# Patient Record
Sex: Female | Born: 1940 | Race: White | Hispanic: No | Marital: Married | Smoking: Never smoker
Health system: Southern US, Community
[De-identification: ages and names within clinical notes are randomized; demographics above are authoritative.]

## PROBLEM LIST (undated history)

## (undated) DIAGNOSIS — F419 Anxiety disorder, unspecified: Secondary | ICD-10-CM

## (undated) DIAGNOSIS — G43909 Migraine, unspecified, not intractable, without status migrainosus: Secondary | ICD-10-CM

## (undated) DIAGNOSIS — C801 Malignant (primary) neoplasm, unspecified: Secondary | ICD-10-CM

## (undated) DIAGNOSIS — I10 Essential (primary) hypertension: Secondary | ICD-10-CM

## (undated) HISTORY — PX: CATARACT EXTRACTION: SUR2

## (undated) HISTORY — PX: JOINT REPLACEMENT: SHX530

## (undated) HISTORY — PX: EYE SURGERY: SHX253

## (undated) HISTORY — PX: BACK SURGERY: SHX140

## (undated) HISTORY — PX: KNEE SURGERY: SHX244

---

## 2006-03-06 ENCOUNTER — Emergency Department: Payer: Self-pay | Admitting: Emergency Medicine

## 2006-03-06 ENCOUNTER — Other Ambulatory Visit: Payer: Self-pay

## 2009-05-03 DIAGNOSIS — K5730 Diverticulosis of large intestine without perforation or abscess without bleeding: Secondary | ICD-10-CM | POA: Insufficient documentation

## 2011-12-31 DIAGNOSIS — M23305 Other meniscus derangements, unspecified medial meniscus, unspecified knee: Secondary | ICD-10-CM | POA: Insufficient documentation

## 2012-03-06 DIAGNOSIS — M1710 Unilateral primary osteoarthritis, unspecified knee: Secondary | ICD-10-CM | POA: Insufficient documentation

## 2012-04-20 DIAGNOSIS — L258 Unspecified contact dermatitis due to other agents: Secondary | ICD-10-CM | POA: Insufficient documentation

## 2012-12-03 DIAGNOSIS — K219 Gastro-esophageal reflux disease without esophagitis: Secondary | ICD-10-CM | POA: Insufficient documentation

## 2013-03-15 DIAGNOSIS — E785 Hyperlipidemia, unspecified: Secondary | ICD-10-CM | POA: Insufficient documentation

## 2013-08-01 ENCOUNTER — Ambulatory Visit: Payer: Self-pay | Admitting: Internal Medicine

## 2014-07-25 DIAGNOSIS — G43709 Chronic migraine without aura, not intractable, without status migrainosus: Secondary | ICD-10-CM | POA: Insufficient documentation

## 2015-01-19 ENCOUNTER — Encounter
Admission: RE | Admit: 2015-01-19 | Discharge: 2015-01-19 | Disposition: A | Discharge status: Home / Self Care | Payer: Medicare Other | Source: Ambulatory Visit | Attending: Internal Medicine | Admitting: Internal Medicine

## 2015-01-20 ENCOUNTER — Other Ambulatory Visit
Admission: RE | Admit: 2015-01-20 | Discharge: 2015-01-20 | Disposition: A | Discharge status: Home / Self Care | Payer: Medicare Other | Source: Skilled Nursing Facility | Attending: Internal Medicine | Admitting: Internal Medicine

## 2015-01-20 DIAGNOSIS — R350 Frequency of micturition: Secondary | ICD-10-CM | POA: Diagnosis present

## 2015-01-20 DIAGNOSIS — R3919 Other difficulties with micturition: Secondary | ICD-10-CM | POA: Insufficient documentation

## 2015-01-20 LAB — URINALYSIS COMPLETE WITH MICROSCOPIC (ARMC ONLY)
Bilirubin Urine: NEGATIVE
Glucose, UA: NEGATIVE mg/dL
Nitrite: NEGATIVE
PH: 7 (ref 5.0–8.0)
Protein, ur: 100 mg/dL — AB
SPECIFIC GRAVITY, URINE: 1.013 (ref 1.005–1.030)

## 2015-01-22 LAB — URINE CULTURE

## 2015-01-29 ENCOUNTER — Encounter
Admission: RE | Admit: 2015-01-29 | Discharge: 2015-01-29 | Disposition: A | Discharge status: Home / Self Care | Payer: Medicare Other | Source: Skilled Nursing Facility | Attending: Internal Medicine | Admitting: Internal Medicine

## 2015-01-29 DIAGNOSIS — R300 Dysuria: Secondary | ICD-10-CM | POA: Diagnosis present

## 2015-01-29 DIAGNOSIS — R350 Frequency of micturition: Secondary | ICD-10-CM | POA: Insufficient documentation

## 2015-01-29 LAB — URINALYSIS COMPLETE WITH MICROSCOPIC (ARMC ONLY)
Bacteria, UA: NONE SEEN
Bilirubin Urine: NEGATIVE
Glucose, UA: NEGATIVE mg/dL
HGB URINE DIPSTICK: NEGATIVE
KETONES UR: NEGATIVE mg/dL
Nitrite: NEGATIVE
PROTEIN: 30 mg/dL — AB
Specific Gravity, Urine: 1.028 (ref 1.005–1.030)
pH: 6 (ref 5.0–8.0)

## 2015-01-31 LAB — URINE CULTURE: Culture: 1000

## 2015-02-28 DIAGNOSIS — G250 Essential tremor: Secondary | ICD-10-CM | POA: Insufficient documentation

## 2016-08-13 ENCOUNTER — Ambulatory Visit
Admission: EM | Admit: 2016-08-13 | Discharge: 2016-08-13 | Disposition: A | Discharge status: Home / Self Care | Payer: Medicare Other | Attending: Family Medicine | Admitting: Family Medicine

## 2016-08-13 ENCOUNTER — Ambulatory Visit (INDEPENDENT_AMBULATORY_CARE_PROVIDER_SITE_OTHER): Payer: Medicare Other

## 2016-08-13 DIAGNOSIS — K5900 Constipation, unspecified: Secondary | ICD-10-CM

## 2016-08-13 DIAGNOSIS — R1011 Right upper quadrant pain: Secondary | ICD-10-CM | POA: Diagnosis not present

## 2016-08-13 HISTORY — DX: Essential (primary) hypertension: I10

## 2016-08-13 HISTORY — DX: Anxiety disorder, unspecified: F41.9

## 2016-08-13 LAB — COMPREHENSIVE METABOLIC PANEL
ALT: 22 U/L (ref 14–54)
AST: 28 U/L (ref 15–41)
Albumin: 4.9 g/dL (ref 3.5–5.0)
Alkaline Phosphatase: 95 U/L (ref 38–126)
Anion gap: 8 (ref 5–15)
BUN: 14 mg/dL (ref 6–20)
CO2: 30 mmol/L (ref 22–32)
CREATININE: 0.76 mg/dL (ref 0.44–1.00)
Calcium: 9.4 mg/dL (ref 8.9–10.3)
Chloride: 100 mmol/L — ABNORMAL LOW (ref 101–111)
Glucose, Bld: 106 mg/dL — ABNORMAL HIGH (ref 65–99)
POTASSIUM: 3.6 mmol/L (ref 3.5–5.1)
Sodium: 138 mmol/L (ref 135–145)
Total Bilirubin: 0.7 mg/dL (ref 0.3–1.2)
Total Protein: 7.9 g/dL (ref 6.5–8.1)

## 2016-08-13 LAB — URINALYSIS, COMPLETE (UACMP) WITH MICROSCOPIC
BACTERIA UA: NONE SEEN
Bilirubin Urine: NEGATIVE
GLUCOSE, UA: NEGATIVE mg/dL
Ketones, ur: NEGATIVE mg/dL
Leukocytes, UA: NEGATIVE
Nitrite: NEGATIVE
PH: 6 (ref 5.0–8.0)
Protein, ur: NEGATIVE mg/dL
SPECIFIC GRAVITY, URINE: 1.015 (ref 1.005–1.030)
Squamous Epithelial / LPF: NONE SEEN

## 2016-08-13 LAB — CBC WITH DIFFERENTIAL/PLATELET
Basophils Absolute: 0.1 10*3/uL (ref 0–0.1)
Basophils Relative: 1 %
Eosinophils Absolute: 0.2 10*3/uL (ref 0–0.7)
Eosinophils Relative: 3 %
HEMATOCRIT: 41.5 % (ref 35.0–47.0)
Hemoglobin: 13.6 g/dL (ref 12.0–16.0)
LYMPHS ABS: 3.1 10*3/uL (ref 1.0–3.6)
Lymphocytes Relative: 35 %
MCH: 29.7 pg (ref 26.0–34.0)
MCHC: 32.9 g/dL (ref 32.0–36.0)
MCV: 90.3 fL (ref 80.0–100.0)
Monocytes Absolute: 0.5 10*3/uL (ref 0.2–0.9)
Monocytes Relative: 6 %
Neutro Abs: 5 10*3/uL (ref 1.4–6.5)
Neutrophils Relative %: 55 %
Platelets: 231 10*3/uL (ref 150–440)
RBC: 4.59 MIL/uL (ref 3.80–5.20)
RDW: 14.2 % (ref 11.5–14.5)
WBC: 8.9 10*3/uL (ref 3.6–11.0)

## 2016-08-13 LAB — AMYLASE: Amylase: 55 U/L (ref 28–100)

## 2016-08-13 LAB — LIPASE, BLOOD: LIPASE: 24 U/L (ref 11–51)

## 2016-08-13 MED ORDER — MAGNESIUM CITRATE PO SOLN: ORAL | 0 refills | Status: DC

## 2016-08-13 NOTE — ED Provider Notes (Signed)
[facility]    CSN: [id number] Arrival date & time: 08/13/16  1106     History   Chief Complaint Chief Complaint  Patient presents with  . Abdominal Pain    lower right    HPI Paula Daniels is a 76 y.o. female.   She is 76 year old white female with a history of abdominal pain. Patient states the last week she's had increasing abdominal pain. She's had history of some abdominal discomfort before but nothing at this level. She states that she's had trouble with constipation for the past. She's been on stool softener uses stool softener makes a difference in tears the constipation. She states that she's not had normal bowel movement several days she did have a bowel movement today but she admits it was not her normal bowel movement. She has had a colonoscopy before in the past. She states that she's has no major medical problems other than some hypertension. Previous surgeries knee surgery and cataract surgery. No pertinent family medical history relevant to today's visit and patient does not smoke this time. She is allergic to Cipro Flagyl and codeine. Should be noted the patient has had diverticulitis before in the past but states when she is not to glide she'll have diarrhea and not constipation.   The history is provided by the patient. No language interpreter was used.  Abdominal Pain  Pain location:  Generalized Pain quality: bloating, cramping, fullness, heavy and pressure   Pain radiates to:  Does not radiate Onset quality:  Unable to specify Timing:  Constant Progression:  Worsening Chronicity:  New Context: diet changes   Relieved by:  Nothing Worsened by:  Nothing Associated symptoms: anorexia, constipation, flatus and nausea   Associated symptoms: no diarrhea and no vomiting   Risk factors: has not had multiple surgeries and no recent hospitalization     Past Medical History:  Diagnosis Date  . Anxiety   . Hypertension     There are no active  problems to display for this patient.   Past Surgical History:  Procedure Laterality Date  . CATARACT EXTRACTION Bilateral   . KNEE SURGERY Right     OB History    No data available       Home Medications    Prior to Admission medications   Medication Sig Start Date End Date Taking? Authorizing Provider  ALPRAZolam Duanne Moron) 0.5 MG tablet Take 0.5 mg by mouth as needed for anxiety.   Yes Historical Provider, MD  amitriptyline (ELAVIL) 25 MG tablet Take 25 mg by mouth at bedtime.   Yes Historical Provider, MD  aspirin 81 MG chewable tablet Chew by mouth daily.   Yes Historical Provider, MD  atorvastatin (LIPITOR) 20 MG tablet Take 20 mg by mouth daily.   Yes Historical Provider, MD  docusate sodium (COLACE) 100 MG capsule Take 100 mg by mouth 2 (two) times daily.   Yes Historical Provider, MD  hydrochlorothiazide (HYDRODIURIL) 25 MG tablet Take 25 mg by mouth daily.   Yes Historical Provider, MD  omeprazole (PRILOSEC) 40 MG capsule Take 40 mg by mouth daily.   Yes Historical Provider, MD  propranolol (INDERAL) 40 MG tablet Take 40 mg by mouth 3 (three) times daily.   Yes Historical Provider, MD  venlafaxine (EFFEXOR) 37.5 MG tablet Take 37.5 mg by mouth daily.   Yes Historical Provider, MD  magnesium citrate SOLN Take one half the bottle on day 1 if unsuccessful please take a whole bottle the second day and  if still not successful she can follow the second bottle 8 hours with remnants of the first bottle 08/13/16   Frederich Cha, MD    Family History History reviewed. No pertinent family history.  Social History Social History  Substance Use Topics  . Smoking status: Never Smoker  . Smokeless tobacco: Never Used  . Alcohol use No     Allergies   Ciprofloxacin; Codeine; and Flagyl [metronidazole]   Review of Systems Review of Systems  Gastrointestinal: Positive for abdominal pain, anorexia, constipation, flatus and nausea. Negative for diarrhea and vomiting.     Physical  Exam Triage Vital Signs ED Triage Vitals  Enc Vitals Group     BP 08/13/16 1215 (!) 156/73     Pulse Rate 08/13/16 1215 (!) 57     Resp 08/13/16 1215 20     Temp 08/13/16 1215 97.7 F (36.5 C)     Temp Source 08/13/16 1215 Oral     SpO2 --      Weight 08/13/16 1215 170 lb (77.1 kg)     Height 08/13/16 1215 5\' 6"  (1.676 m)     Head Circumference --      Peak Flow --      Pain Score 08/13/16 1219 6     Pain Loc --      Pain Edu? --      Excl. in [place]? --    No data found.   Updated Vital Signs BP (!) 156/73 (BP Location: Left Arm)   Pulse (!) 57   Temp 97.7 F (36.5 C) (Oral)   Resp 20   Ht 5\' 6"  (1.676 m)   Wt 170 lb (77.1 kg)   BMI 27.44 kg/m   Visual Acuity Right Eye Distance:   Left Eye Distance:   Bilateral Distance:    Right Eye Near:   Left Eye Near:    Bilateral Near:     Physical Exam  Constitutional: She appears well-developed and well-nourished.  HENT:  Head: Normocephalic and atraumatic.  Right Ear: External ear normal.  Left Ear: External ear normal.  Mouth/Throat: Oropharynx is clear and moist.  Eyes: Pupils are equal, round, and reactive to light.  Neck: Normal range of motion. Neck supple.  Cardiovascular: Normal rate and regular rhythm.   Pulmonary/Chest: Effort normal.  Abdominal: Soft. She exhibits distension. Bowel sounds are decreased. There is no hepatosplenomegaly. There is generalized tenderness. There is no rigidity and no CVA tenderness. No hernia.    Patient had hyperactive bowel sounds. Diffuse abdominal tenderness mostly tenderness over the right upper quadrant and right lower quadrant but still some tenderness over the left as well.  Lymphadenopathy:    She has no cervical adenopathy.  Vitals reviewed.    UC Treatments / Results  Labs (all labs ordered are listed, but only abnormal results are displayed) Labs Reviewed  COMPREHENSIVE METABOLIC PANEL - Abnormal; Notable for the following:       Result Value   Chloride 100  (*)    Glucose, Bld 106 (*)    All other components within normal limits  URINALYSIS, COMPLETE (UACMP) WITH MICROSCOPIC - Abnormal; Notable for the following:    Color, Urine STRAW (*)    Hgb urine dipstick TRACE (*)    All other components within normal limits  URINE CULTURE  CBC WITH DIFFERENTIAL/PLATELET  LIPASE, BLOOD  AMYLASE    EKG  EKG Interpretation None       Radiology Dg Abd Acute W/chest  Result Date: 08/13/2016 CLINICAL  DATA:  Right lower quadrant pain, right flank pain for 3 weeks EXAM: DG ABDOMEN ACUTE W/ 1V CHEST COMPARISON:  None. FINDINGS: There is no evidence of dilated bowel loops or free intraperitoneal air. There is a moderate amount of stool throughout the colon. No radiopaque calculi or other significant radiographic abnormality is seen. Heart size and mediastinal contours are within normal limits. Both lungs are clear. IMPRESSION: Negative abdominal radiographs.  No acute cardiopulmonary disease. Electronically Signed   By: Kathreen Devoid   On: 08/13/2016 13:29    Procedures Procedures (including critical care time)  Medications Ordered in UC Medications - No data to display   Initial Impression / Assessment and Plan / UC Course  I have reviewed the triage vital signs and the nursing notes.  Pertinent labs & imaging results that were available during my care of the patient were reviewed by me and considered in my medical decision making (see chart for details).   Results for orders placed or performed during the hospital encounter of 08/13/16  CBC with Differential  Result Value Ref Range   WBC 8.9 3.6 - 11.0 K/uL   RBC 4.59 3.80 - 5.20 MIL/uL   Hemoglobin 13.6 12.0 - 16.0 g/dL   HCT 41.5 35.0 - 47.0 %   MCV 90.3 80.0 - 100.0 fL   MCH 29.7 26.0 - 34.0 pg   MCHC 32.9 32.0 - 36.0 g/dL   RDW 14.2 11.5 - 14.5 %   Platelets 231 150 - 440 K/uL   Neutrophils Relative % 55 %   Neutro Abs 5.0 1.4 - 6.5 K/uL   Lymphocytes Relative 35 %   Lymphs Abs 3.1  1.0 - 3.6 K/uL   Monocytes Relative 6 %   Monocytes Absolute 0.5 0.2 - 0.9 K/uL   Eosinophils Relative 3 %   Eosinophils Absolute 0.2 0 - 0.7 K/uL   Basophils Relative 1 %   Basophils Absolute 0.1 0 - 0.1 K/uL  Comprehensive metabolic panel  Result Value Ref Range   Sodium 138 135 - 145 mmol/L   Potassium 3.6 3.5 - 5.1 mmol/L   Chloride 100 (L) 101 - 111 mmol/L   CO2 30 22 - 32 mmol/L   Glucose, Bld 106 (H) 65 - 99 mg/dL   BUN 14 6 - 20 mg/dL   Creatinine, Ser 0.76 0.44 - 1.00 mg/dL   Calcium 9.4 8.9 - 10.3 mg/dL   Total Protein 7.9 6.5 - 8.1 g/dL   Albumin 4.9 3.5 - 5.0 g/dL   AST 28 15 - 41 U/L   ALT 22 14 - 54 U/L   Alkaline Phosphatase 95 38 - 126 U/L   Total Bilirubin 0.7 0.3 - 1.2 mg/dL   GFR calc non Af Amer >60 >60 mL/min   GFR calc Af Amer >60 >60 mL/min   Anion gap 8 5 - 15  Lipase, blood  Result Value Ref Range   Lipase 24 11 - 51 U/L  Amylase  Result Value Ref Range   Amylase 55 28 - 100 U/L  Urinalysis, Complete w Microscopic  Result Value Ref Range   Color, Urine STRAW (A) YELLOW   APPearance CLEAR CLEAR   Specific Gravity, Urine 1.015 1.005 - 1.030   pH 6.0 5.0 - 8.0   Glucose, UA NEGATIVE NEGATIVE mg/dL   Hgb urine dipstick TRACE (A) NEGATIVE   Bilirubin Urine NEGATIVE NEGATIVE   Ketones, ur NEGATIVE NEGATIVE mg/dL   Protein, ur NEGATIVE NEGATIVE mg/dL   Nitrite NEGATIVE NEGATIVE  Leukocytes, UA NEGATIVE NEGATIVE   Squamous Epithelial / LPF NONE SEEN NONE SEEN   WBC, UA 0-5 0 - 5 WBC/hpf   RBC / HPF 0-5 0 - 5 RBC/hpf   Bacteria, UA NONE SEEN NONE SEEN   Clinical Course   Patient with abdominal pain. X-ray showing moderate stool burden indicates that this is probably from constipation which is consistent with her history. We'll try mag citrate half bottle today if that does work more coming O bottle tomorrow and if that is what she did take the other half of today's bottle in the afternoon 8 hours after the initial bottle. Follow-up PCP things not  better should be noted the patient has had a colonoscopy before.  Final Clinical Impressions(s) / UC Diagnoses   Final diagnoses:  Constipation, unspecified constipation type  Right upper quadrant abdominal pain    New Prescriptions Discharge Medication List as of 08/13/2016  2:07 PM    START taking these medications   Details  magnesium citrate SOLN Take one half the bottle on day 1 if unsuccessful please take a whole bottle the second day and if still not successful she can follow the second bottle 8 hours with remnants of the first bottle, Normal          Note: This dictation was prepared with Dragon dictation along with smaller phrase technology. Any transcriptional errors that result from this process are unintentional.   Frederich Cha, MD 08/13/16 1415

## 2016-08-13 NOTE — ED Triage Notes (Signed)
Pt c/o lower right hip area pain. She has been taking stool softeners and it has helped some, but it still hurts when she leans up against something. She also mentions lower back pain.

## 2016-08-15 LAB — URINE CULTURE: SPECIAL REQUESTS: NORMAL

## 2016-08-16 ENCOUNTER — Encounter: Payer: Self-pay | Admitting: *Deleted

## 2016-08-16 ENCOUNTER — Ambulatory Visit
Admission: EM | Admit: 2016-08-16 | Discharge: 2016-08-16 | Disposition: A | Discharge status: Home / Self Care | Payer: Medicare Other | Attending: Family Medicine | Admitting: Family Medicine

## 2016-08-16 ENCOUNTER — Telehealth: Payer: Self-pay | Admitting: *Deleted

## 2016-08-16 DIAGNOSIS — R300 Dysuria: Secondary | ICD-10-CM

## 2016-08-16 DIAGNOSIS — K602 Anal fissure, unspecified: Secondary | ICD-10-CM | POA: Diagnosis not present

## 2016-08-16 LAB — URINALYSIS, COMPLETE (UACMP) WITH MICROSCOPIC
Bacteria, UA: NONE SEEN
Bilirubin Urine: NEGATIVE
Glucose, UA: NEGATIVE mg/dL
HGB URINE DIPSTICK: NEGATIVE
Ketones, ur: NEGATIVE mg/dL
Nitrite: NEGATIVE
PH: 7 (ref 5.0–8.0)
PROTEIN: NEGATIVE mg/dL
Specific Gravity, Urine: 1.015 (ref 1.005–1.030)

## 2016-08-16 NOTE — Telephone Encounter (Signed)
Called patient, verified DOB, communicated inconclusive urine culture results. Patient reported still having burning during urination. Advised patient to follow up with PCP or [facility] for additional treatment. Patient stated that she will return to [facility] for additional treatment.

## 2016-08-16 NOTE — ED Triage Notes (Signed)
Patient has returned for urine culture due to a inconclusive result and the last urine culture. Patient is still having symptom of burning when urinating. Patient also reports bright red blood in stool this AM.

## 2016-08-17 LAB — URINE CULTURE
Culture: <10000 — AB
Special Requests: NORMAL

## 2016-08-18 ENCOUNTER — Telehealth: Payer: Self-pay | Admitting: *Deleted

## 2016-08-18 NOTE — Telephone Encounter (Signed)
Called patient, verified DOB, communicated negative urine culture results. Patient confirmed understanding of results.

## 2016-10-24 NOTE — ED Provider Notes (Signed)
[facility]    CSN: [id number] Arrival date & time: 08/16/16  1052     History   Chief Complaint Chief Complaint  Patient presents with  . Urinary Frequency  . Rectal Bleeding    HPI Paula Daniels is a 76 y.o. female.   76 yo female here with a c/o continuing mild dysuria. Seen recently for same and urine culture inconclusive. Also states today also noticed some bright red blood on toilet paper after bowel movement. Denies any fevers, chills, abdominal pain.     Urinary Frequency   Rectal Bleeding    Past Medical History:  Diagnosis Date  . Anxiety   . Hypertension     There are no active problems to display for this patient.   Past Surgical History:  Procedure Laterality Date  . CATARACT EXTRACTION Bilateral   . KNEE SURGERY Right     OB History    No data available       Home Medications    Prior to Admission medications   Medication Sig Start Date End Date Taking? Authorizing Provider  ALPRAZolam Duanne Moron) 0.5 MG tablet Take 0.5 mg by mouth as needed for anxiety.    Historical Provider, MD  amitriptyline (ELAVIL) 25 MG tablet Take 25 mg by mouth at bedtime.    Historical Provider, MD  aspirin 81 MG chewable tablet Chew by mouth daily.    Historical Provider, MD  atorvastatin (LIPITOR) 20 MG tablet Take 20 mg by mouth daily.    Historical Provider, MD  docusate sodium (COLACE) 100 MG capsule Take 100 mg by mouth 2 (two) times daily.    Historical Provider, MD  hydrochlorothiazide (HYDRODIURIL) 25 MG tablet Take 25 mg by mouth daily.    Historical Provider, MD  magnesium citrate SOLN Take one half the bottle on day 1 if unsuccessful please take a whole bottle the second day and if still not successful she can follow the second bottle 8 hours with remnants of the first bottle 08/13/16   Frederich Cha, MD  omeprazole (PRILOSEC) 40 MG capsule Take 40 mg by mouth daily.    Historical Provider, MD  propranolol (INDERAL) 40 MG tablet Take 40 mg by  mouth 3 (three) times daily.    Historical Provider, MD  venlafaxine (EFFEXOR) 37.5 MG tablet Take 37.5 mg by mouth daily.    Historical Provider, MD    Family History History reviewed. No pertinent family history.  Social History Social History  Substance Use Topics  . Smoking status: Never Smoker  . Smokeless tobacco: Never Used  . Alcohol use No     Allergies   Ciprofloxacin; Codeine; and Flagyl [metronidazole]   Review of Systems Review of Systems  Gastrointestinal: Positive for hematochezia.  Genitourinary: Positive for frequency.     Physical Exam Triage Vital Signs ED Triage Vitals  Enc Vitals Group     BP 08/16/16 1204 (!) 144/53     Pulse Rate 08/16/16 1204 (!) 59     Resp 08/16/16 1204 16     Temp 08/16/16 1204 98 F (36.7 C)     Temp Source 08/16/16 1204 Oral     SpO2 08/16/16 1204 100 %     Weight 08/16/16 1205 170 lb (77.1 kg)     Height 08/16/16 1205 5\' 6"  (1.676 m)     Head Circumference --      Peak Flow --      Pain Score 08/16/16 1209 5     Pain Loc --  Pain Edu? --      Excl. in [place]? --    No data found.   Updated Vital Signs BP (!) 144/53 (BP Location: Left Arm)   Pulse (!) 59   Temp 98 F (36.7 C) (Oral)   Resp 16   Ht 5\' 6"  (1.676 m)   Wt 170 lb (77.1 kg)   SpO2 100%   BMI 27.44 kg/m   Visual Acuity Right Eye Distance:   Left Eye Distance:   Bilateral Distance:    Right Eye Near:   Left Eye Near:    Bilateral Near:     Physical Exam  Constitutional: She appears well-developed and well-nourished. No distress.  Abdominal: Soft. Bowel sounds are normal. She exhibits no distension and no mass. There is no tenderness. There is no rebound and no guarding. No hernia.  Genitourinary: Pelvic exam was performed with patient supine.  Genitourinary Comments: Positive anal fissure  Skin: She is not diaphoretic.  Nursing note and vitals reviewed.    UC Treatments / Results  Labs (all labs ordered are listed, but only  abnormal results are displayed) Labs Reviewed  URINE CULTURE - Abnormal; Notable for the following:       Result Value   Culture   (*)    Value: <10,000 COLONIES/mL INSIGNIFICANT GROWTH Performed at [facility]    All other components within normal limits  URINALYSIS, COMPLETE (UACMP) WITH MICROSCOPIC - Abnormal; Notable for the following:    Color, Urine STRAW (*)    Leukocytes, UA TRACE (*)    Squamous Epithelial / LPF 6-30 (*)    All other components within normal limits    EKG  EKG Interpretation None       Radiology No results found.  Procedures Procedures (including critical care time)  Medications Ordered in UC Medications - No data to display   Initial Impression / Assessment and Plan / UC Course  I have reviewed the triage vital signs and the nursing notes.  Pertinent labs & imaging results that were available during my care of the patient were reviewed by me and considered in my medical decision making (see chart for details).       Final Clinical Impressions(s) / UC Diagnoses   Final diagnoses:  Dysuria  Anal fissure    New Prescriptions Discharge Medication List as of 08/16/2016  1:14 PM     1. Lab results and diagnosis reviewed with patient 2. Recommend supportive treatment 3. repeat urine culture 4. Follow-up prn if symptoms worsen or don't improve   Norval Gable, MD 10/24/16 1302

## 2016-11-08 DIAGNOSIS — M48061 Spinal stenosis, lumbar 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 claudication: Secondary | ICD-10-CM | POA: Insufficient documentation

## 2016-12-17 DIAGNOSIS — M4310 Spondylolisthesis, site unspecified: Secondary | ICD-10-CM | POA: Insufficient documentation

## 2017-01-22 IMAGING — CR DG ABDOMEN ACUTE W/ 1V CHEST
4 series · 4 of 4 positions shown · non-contrast
Comparison: None.

CLINICAL DATA: Right lower quadrant pain, right flank pain for 3
weeks

EXAM:
DG ABDOMEN ACUTE W/ 1V CHEST

[chest pa]
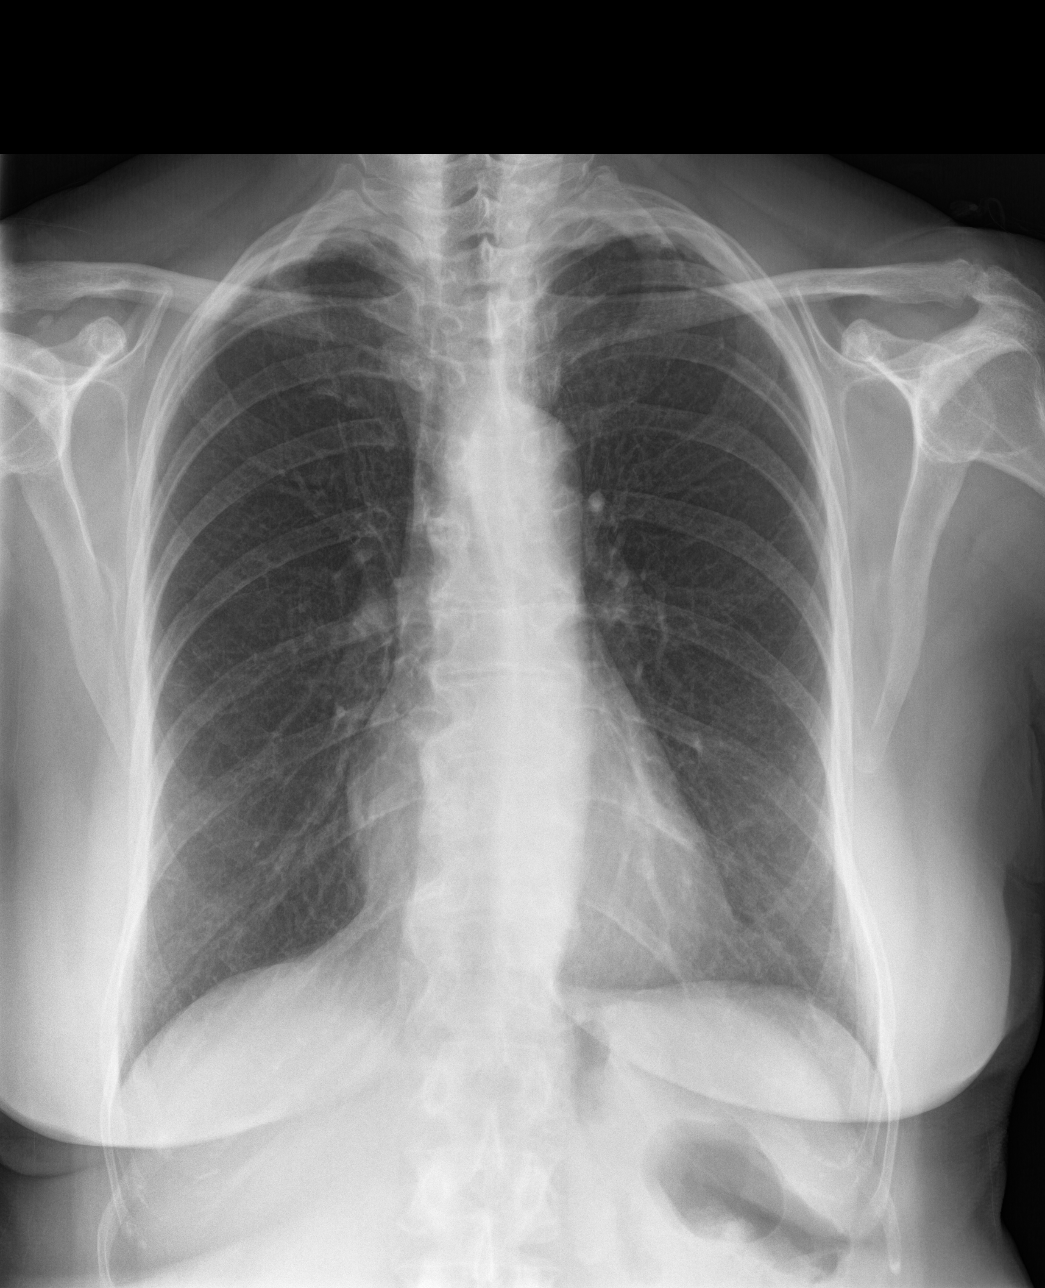

[abdomen erect]
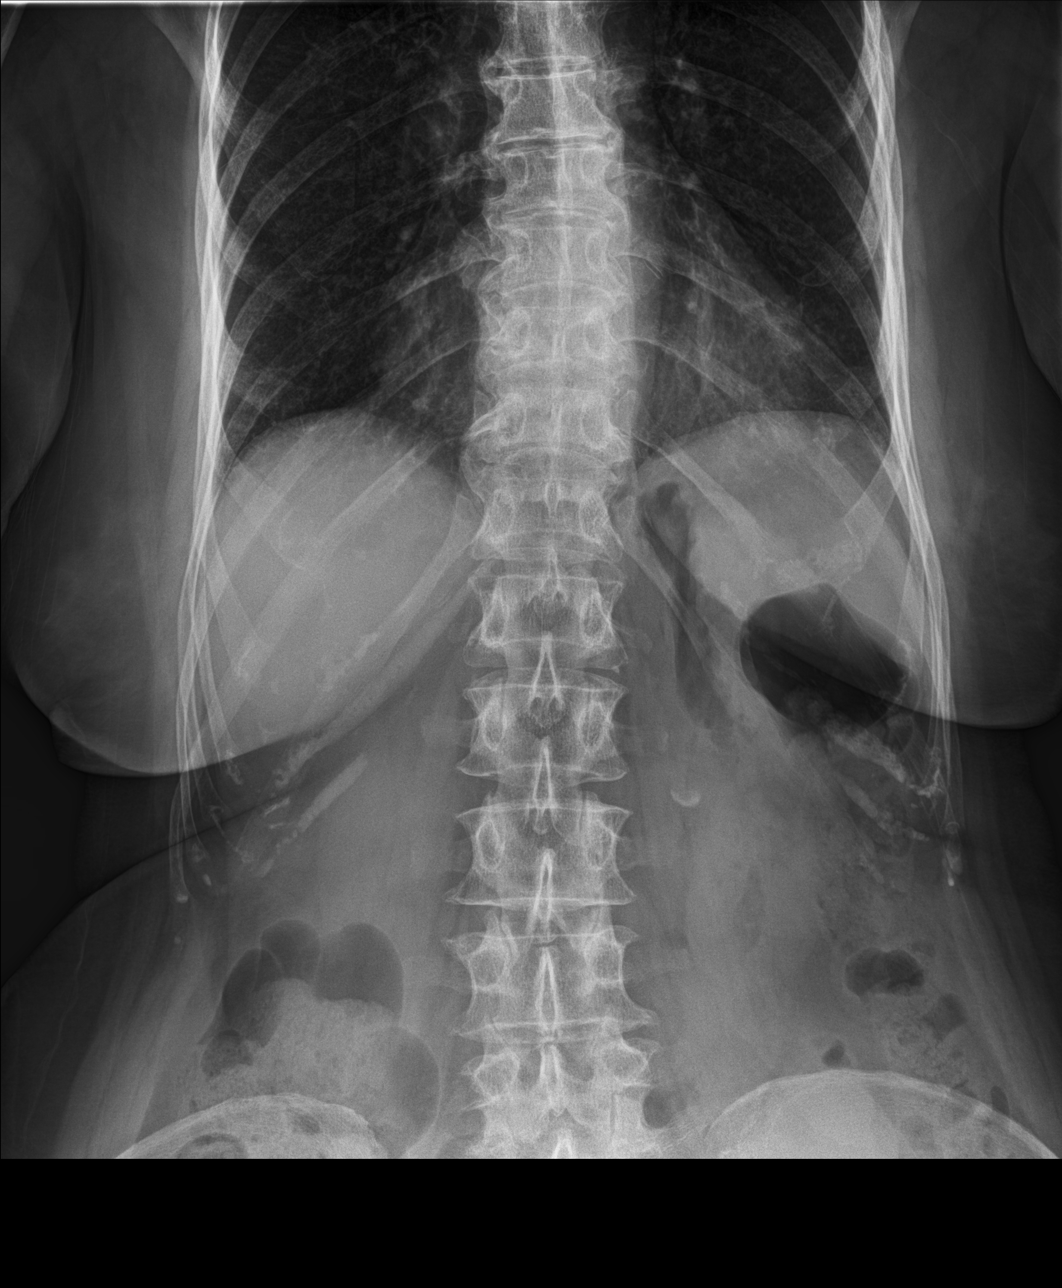

[abdomen supine (1 of 2)]
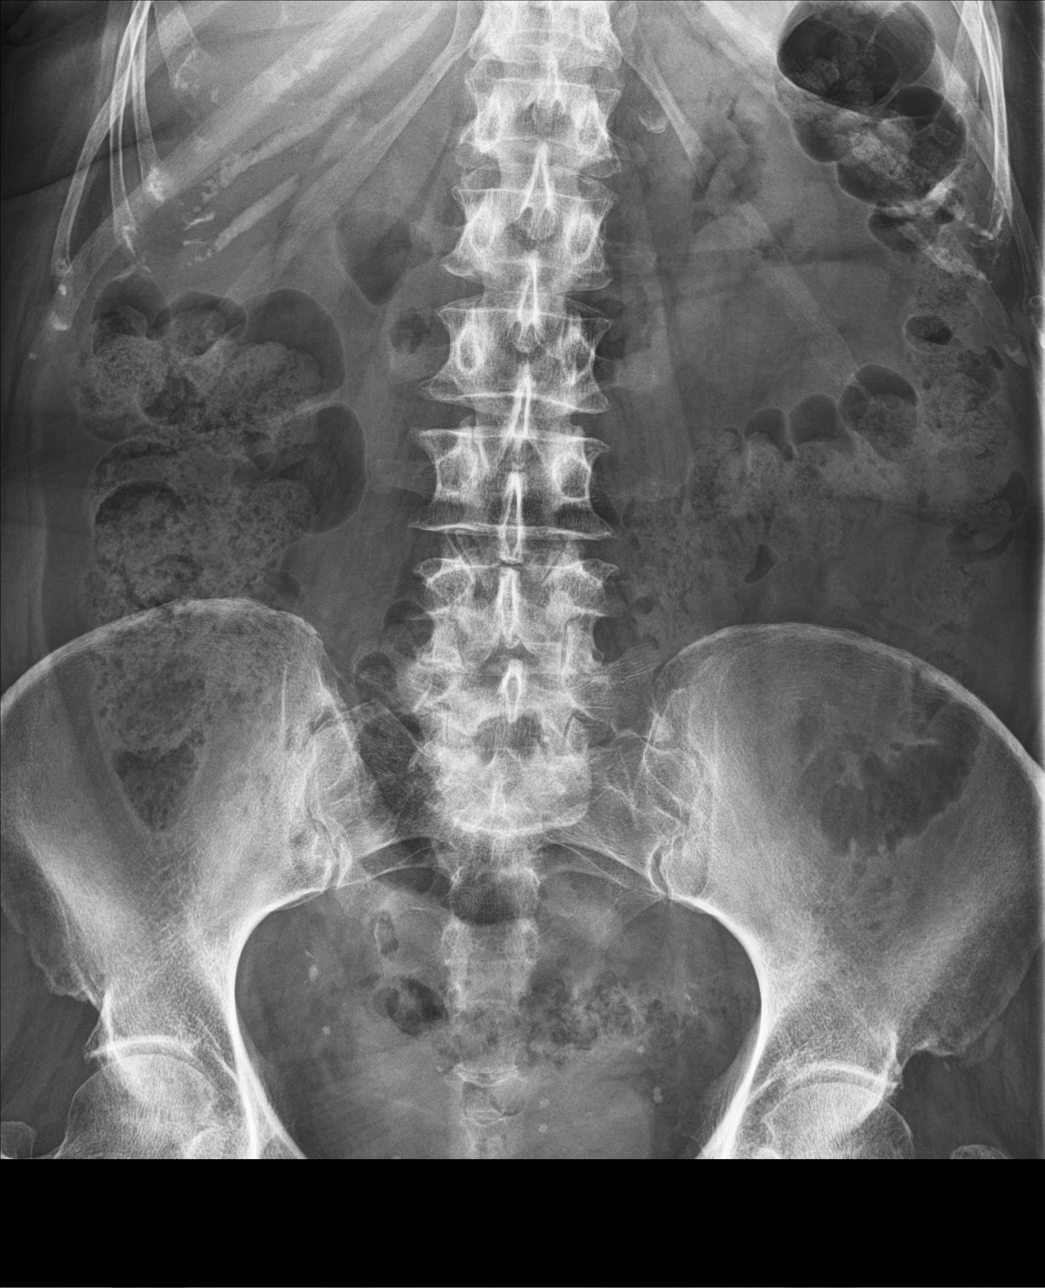

[abdomen supine (2 of 2)]
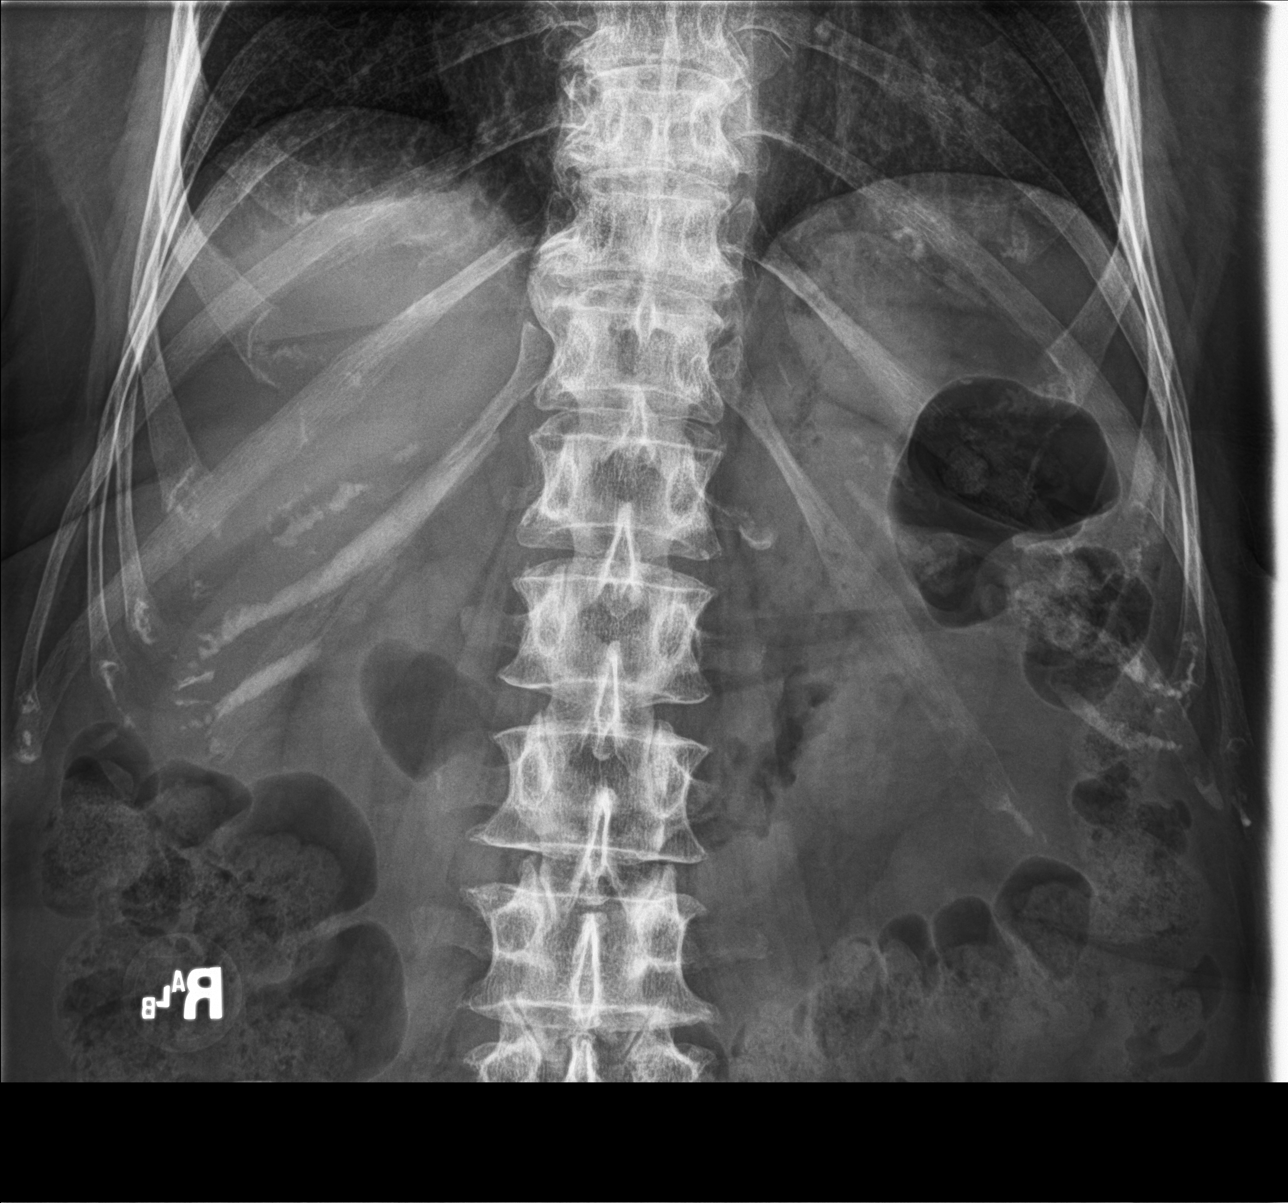

[4 of 4 positions shown; findings below may reference images not displayed]

FINDINGS: There is no evidence of dilated bowel loops or free intraperitoneal
air. There is a moderate amount of stool throughout the colon. No
radiopaque calculi or other significant radiographic abnormality is
seen. Heart size and mediastinal contours are within normal limits.
Both lungs are clear.
IMPRESSION: Negative abdominal radiographs.  No acute cardiopulmonary disease.

## 2017-10-11 ENCOUNTER — Ambulatory Visit
Admission: EM | Admit: 2017-10-11 | Discharge: 2017-10-11 | Disposition: A | Discharge status: Home / Self Care | Payer: Medicare Other | Attending: Family Medicine | Admitting: Family Medicine

## 2017-10-11 ENCOUNTER — Other Ambulatory Visit: Payer: Self-pay

## 2017-10-11 ENCOUNTER — Encounter: Payer: Self-pay | Admitting: *Deleted

## 2017-10-11 DIAGNOSIS — J069 Acute upper respiratory infection, unspecified: Secondary | ICD-10-CM | POA: Diagnosis not present

## 2017-10-11 DIAGNOSIS — B9789 Other viral agents as the cause of diseases classified elsewhere: Secondary | ICD-10-CM

## 2017-10-11 MED ORDER — PREDNISONE 20 MG PO TABS: 20.0000 mg | ORAL_TABLET | Freq: Every day | ORAL | 0 refills | Status: DC

## 2017-10-11 NOTE — ED Triage Notes (Signed)
Patient started having nasal congestion / drainage and severe headache 2 days ago.

## 2017-10-11 NOTE — ED Provider Notes (Signed)
[facility]    CSN: [id number] Arrival date & time: 10/11/17  0847     History   Chief Complaint Chief Complaint  Patient presents with  . Nasal Congestion    HPI Paula Daniels is a 77 y.o. female.   The history is provided by the patient.  URI  Presenting symptoms: congestion, cough and rhinorrhea   Severity:  Moderate Onset quality:  Sudden Duration:  2 days Timing:  Constant Progression:  Unchanged Chronicity:  New Relieved by:  None tried Ineffective treatments:  None tried Associated symptoms: sinus pain   Associated symptoms: no wheezing   Risk factors: being elderly and sick contacts   Risk factors: no chronic cardiac disease, no chronic kidney disease, no chronic respiratory disease, no diabetes mellitus, no immunosuppression, no recent illness and no recent travel     Past Medical History:  Diagnosis Date  . Anxiety   . Hypertension     There are no active problems to display for this patient.   Past Surgical History:  Procedure Laterality Date  . CATARACT EXTRACTION Bilateral   . KNEE SURGERY Right     OB History    No data available       Home Medications    Prior to Admission medications   Medication Sig Start Date End Date Taking? Authorizing Provider  ALPRAZolam Duanne Moron) 0.5 MG tablet Take 0.5 mg by mouth as needed for anxiety.   Yes [provider]  amitriptyline (ELAVIL) 25 MG tablet Take 25 mg by mouth at bedtime.   Yes [provider]  aspirin 81 MG chewable tablet Chew by mouth daily.   Yes [provider]  atorvastatin (LIPITOR) 20 MG tablet Take 20 mg by mouth daily.   Yes [provider]  docusate sodium (COLACE) 100 MG capsule Take 100 mg by mouth 2 (two) times daily.   Yes [provider]  hydrochlorothiazide (HYDRODIURIL) 25 MG tablet Take 25 mg by mouth daily.   Yes [provider]  omeprazole (PRILOSEC) 40 MG capsule Take 40 mg by mouth daily.   Yes  [provider]  propranolol (INDERAL) 40 MG tablet Take 40 mg by mouth 3 (three) times daily.   Yes [provider]  venlafaxine (EFFEXOR) 37.5 MG tablet Take 37.5 mg by mouth daily.   Yes [provider]  magnesium citrate SOLN Take one half the bottle on day 1 if unsuccessful please take a whole bottle the second day and if still not successful she can follow the second bottle 8 hours with remnants of the first bottle 08/13/16   Frederich Cha, MD  predniSONE (DELTASONE) 20 MG tablet Take 1 tablet (20 mg total) by mouth daily. 10/11/17   Norval Gable, MD    Family History History reviewed. No pertinent family history.  Social History Social History   Tobacco Use  . Smoking status: Never Smoker  . Smokeless tobacco: Never Used  Substance Use Topics  . Alcohol use: No  . Drug use: No     Allergies   Ciprofloxacin; Codeine; and Flagyl [metronidazole]   Review of Systems Review of Systems  HENT: Positive for congestion, rhinorrhea and sinus pain.   Respiratory: Positive for cough. Negative for wheezing.      Physical Exam Triage Vital Signs ED Triage Vitals  Enc Vitals Group     BP 10/11/17 0928 (!) 150/68     Pulse Rate 10/11/17 0928 (!) 52     Resp 10/11/17 0928 16  Temp 10/11/17 0928 (!) 97.5 F (36.4 C)     Temp Source 10/11/17 0928 Oral     SpO2 10/11/17 0928 97 %     Weight 10/11/17 0931 170 lb (77.1 kg)     Height 10/11/17 0931 5\' 6"  (1.676 m)     Head Circumference --      Peak Flow --      Pain Score 10/11/17 0930 10     Pain Loc --      Pain Edu? --      Excl. in [place]? --    No data found.  Updated Vital Signs BP (!) 150/68 (BP Location: Left Arm)   Pulse (!) 52   Temp (!) 97.5 F (36.4 C) (Oral)   Resp 16   Ht 5\' 6"  (1.676 m)   Wt 170 lb (77.1 kg)   SpO2 97%   BMI 27.44 kg/m   Visual Acuity Right Eye Distance:   Left Eye Distance:   Bilateral Distance:    Right Eye Near:   Left Eye Near:    Bilateral Near:       Physical Exam  Constitutional: She appears well-developed and well-nourished. No distress.  HENT:  Head: Normocephalic and atraumatic.  Right Ear: Tympanic membrane, external ear and ear canal normal.  Left Ear: Tympanic membrane, external ear and ear canal normal.  Nose: Mucosal edema and rhinorrhea present. No nose lacerations, sinus tenderness, nasal deformity, septal deviation or nasal septal hematoma. No epistaxis.  No foreign bodies. Right sinus exhibits no maxillary sinus tenderness and no frontal sinus tenderness. Left sinus exhibits no maxillary sinus tenderness and no frontal sinus tenderness.  Mouth/Throat: Uvula is midline, oropharynx is clear and moist and mucous membranes are normal. No oropharyngeal exudate.  Eyes: Conjunctivae are normal. Right eye exhibits no discharge. Left eye exhibits no discharge. No scleral icterus.  Neck: Normal range of motion. Neck supple. No thyromegaly present.  Cardiovascular: Normal rate, regular rhythm and normal heart sounds.  Pulmonary/Chest: Effort normal and breath sounds normal. No stridor. No respiratory distress. She has no wheezes. She has no rales.  Lymphadenopathy:    She has no cervical adenopathy.  Skin: She is not diaphoretic.  Nursing note and vitals reviewed.    UC Treatments / Results  Labs (all labs ordered are listed, but only abnormal results are displayed) Labs Reviewed - No data to display  EKG  EKG Interpretation None       Radiology No results found.  Procedures Procedures (including critical care time)  Medications Ordered in UC Medications - No data to display   Initial Impression / Assessment and Plan / UC Course  I have reviewed the triage vital signs and the nursing notes.  Pertinent labs & imaging results that were available during my care of the patient were reviewed by me and considered in my medical decision making (see chart for details).       Final Clinical Impressions(s) / UC  Diagnoses   Final diagnoses:  Viral URI with cough    ED Discharge Orders        Ordered    predniSONE (DELTASONE) 20 MG tablet  Daily     10/11/17 0958     1. diagnosis reviewed with patient 2. rx as per orders above; reviewed possible side effects, interactions, risks and benefits  3. Recommend supportive treatment with rest, fluids, otc meds 4. Follow-up prn if symptoms worsen or don't improve  Controlled Substance Prescriptions [place] Controlled Substance Registry consulted?  Not Applicable   Norval Gable, MD 10/11/17 1003

## 2018-04-10 ENCOUNTER — Encounter
Admission: RE | Admit: 2018-04-10 | Discharge: 2018-04-10 | Disposition: A | Discharge status: Home / Self Care | Payer: Medicare Other | Source: Ambulatory Visit | Attending: Internal Medicine | Admitting: Internal Medicine

## 2018-04-12 ENCOUNTER — Encounter
Admission: RE | Admit: 2018-04-12 | Discharge: 2018-04-12 | Disposition: A | Discharge status: Home / Self Care | Payer: Medicare Other | Source: Ambulatory Visit | Attending: Internal Medicine | Admitting: Internal Medicine

## 2018-04-14 DIAGNOSIS — F411 Generalized anxiety disorder: Secondary | ICD-10-CM | POA: Insufficient documentation

## 2018-04-14 DIAGNOSIS — I10 Essential (primary) hypertension: Secondary | ICD-10-CM | POA: Insufficient documentation

## 2018-04-14 DIAGNOSIS — M170 Bilateral primary osteoarthritis of knee: Secondary | ICD-10-CM | POA: Insufficient documentation

## 2018-04-20 ENCOUNTER — Encounter: Payer: Self-pay | Admitting: Adult Health

## 2018-04-20 ENCOUNTER — Other Ambulatory Visit: Payer: Self-pay

## 2018-04-20 ENCOUNTER — Non-Acute Institutional Stay (SKILLED_NURSING_FACILITY): Payer: Medicare Other | Admitting: Adult Health

## 2018-04-20 DIAGNOSIS — Z96652 Presence of left artificial knee joint: Secondary | ICD-10-CM

## 2018-04-20 DIAGNOSIS — I10 Essential (primary) hypertension: Secondary | ICD-10-CM | POA: Diagnosis not present

## 2018-04-20 DIAGNOSIS — M1712 Unilateral primary osteoarthritis, left knee: Secondary | ICD-10-CM

## 2018-04-20 NOTE — Progress Notes (Signed)
Location:   [facility] Room Number: [phone] Place of Service:  SNF (31)    CODE STATUS: FULL  Allergies  Allergen Reactions  . Ciprofloxacin Itching and Rash  . Codeine Nausea Only and Nausea And Vomiting  . Metronidazole Rash and Itching    Chief Complaint  Patient presents with  . Discharge Note    Discharging from SNF 04/20/18     HPI:  She is being discharged to home with home health for pt/ot. She will not need any dme has all necessary dme. She will need her prescriptions written and will need to follow up with her medical provider.  She had been hospitalized for a left knee replacement and was admitted to this facility for short term rehab. She is now ready for discharge to home.    Past Medical History:  Diagnosis Date  . Anxiety   . Hypertension     Past Surgical History:  Procedure Laterality Date  . CATARACT EXTRACTION Bilateral   . KNEE SURGERY Right     Social History   Socioeconomic History  . Marital status: Married    Spouse name: Not on file  . Number of children: Not on file  . Years of education: Not on file  . Highest education level: Not on file  Occupational History  . Not on file  Social Needs  . Financial resource strain: Not on file  . Food insecurity:    Worry: Not on file    Inability: Not on file  . Transportation needs:    Medical: Not on file    Non-medical: Not on file  Tobacco Use  . Smoking status: Never Smoker  . Smokeless tobacco: Never Used  Substance and Sexual Activity  . Alcohol use: No  . Drug use: No  . Sexual activity: Not on file  Lifestyle  . Physical activity:    Days per week: Not on file    Minutes per session: Not on file  . Stress: Not on file  Relationships  . Social connections:    Talks on phone: Not on file    Gets together: Not on file    Attends religious service: Not on file    Active member of club or organization: Not on file    Attends meetings of clubs or  organizations: Not on file    Relationship status: Not on file  . Intimate partner violence:    Fear of current or ex partner: Not on file    Emotionally abused: Not on file    Physically abused: Not on file    Forced sexual activity: Not on file  Other Topics Concern  . Not on file  Social History Narrative  . Not on file   History reviewed. No pertinent family history.  VITAL SIGNS BP 131/68   Pulse (!) 58   Temp 98.2 F (36.8 C) (Oral)   Resp 18   Ht 5\' 6"  (1.676 m)   Wt 183 lb 6.4 oz (83.2 kg)   SpO2 94%   BMI 29.60 kg/m   Patient's Medications  New Prescriptions   No medications on file  Previous Medications   ACETAMINOPHEN (TYLENOL) 500 MG TABLET    Take 500 mg by mouth 3 (three) times daily.   ALPRAZOLAM (XANAX) 0.5 MG TABLET    Take 0.5 mg by mouth 3 (three) times daily as needed for anxiety or sleep.    AMITRIPTYLINE (ELAVIL) 25 MG TABLET    Take  25 mg by mouth at bedtime.    ASPIRIN 325 MG EC TABLET    Take 325 mg by mouth 2 (two) times daily.   ATORVASTATIN (LIPITOR) 20 MG TABLET    Take 20 mg by mouth daily.    CHOLECALCIFEROL 1000 UNITS TABLET    Take 2,000 Units by mouth daily.   DICLOFENAC SODIUM (VOLTAREN) 1 % GEL    Apply 2 g topically 4 (four) times daily as needed. apply to affected area as needed for pain   HYDROCHLOROTHIAZIDE (HYDRODIURIL) 25 MG TABLET    Take 25 mg by mouth daily.    OMEPRAZOLE (PRILOSEC) 40 MG CAPSULE    Take 40 mg by mouth daily.   ONDANSETRON (ZOFRAN) 4 MG TABLET    Take 4 mg by mouth every 8 (eight) hours as needed for nausea or vomiting.   OXYCODONE (OXY-IR) 5 MG CAPSULE    Take 5-10 mg by mouth every 4 (four) hours as needed for pain. For mild to moderate take 1 tab for moderate to severe take 2   POLYETHYLENE GLYCOL (MIRALAX / GLYCOLAX) PACKET    Take 17 g by mouth daily. take daily in 4-6 ounce fluid for 20 days for constipation   POTASSIUM CHLORIDE SA (K-DUR,KLOR-CON) 20 MEQ TABLET    Take 20 mEq by mouth daily.   PROPRANOLOL  (INDERAL) 20 MG TABLET    Take 20 mg by mouth 2 (two) times daily.   SENNA (SENOKOT) 8.6 MG TABLET    Take 2 tablets by mouth at bedtime as needed for constipation.   VENLAFAXINE (EFFEXOR) 37.5 MG TABLET    Take 37.5 mg by mouth daily.  Modified Medications   No medications on file  Discontinued Medications   ASPIRIN 81 MG CHEWABLE TABLET    Chew by mouth daily.   DOCUSATE SODIUM (COLACE) 100 MG CAPSULE    Take 100 mg by mouth 2 (two) times daily.   MAGNESIUM CITRATE SOLN    Take one half the bottle on day 1 if unsuccessful please take a whole bottle the second day and if still not successful she can follow the second bottle 8 hours with remnants of the first bottle   PREDNISONE (DELTASONE) 20 MG TABLET    Take 1 tablet (20 mg total) by mouth daily.   PROPRANOLOL (INDERAL) 40 MG TABLET    Take 40 mg by mouth 3 (three) times daily.     SIGNIFICANT DIAGNOSTIC EXAMS  LABS REVIEWED: TODAY:   03-26-18: wbc 7.4; hgb 12.3; hct 37.4; mcv 90.9; plt 220   Review of Systems  Constitutional: Negative for malaise/fatigue.  Respiratory: Negative for cough and shortness of breath.   Cardiovascular: Negative for chest pain, palpitations and leg swelling.  Gastrointestinal: Negative for abdominal pain, constipation and heartburn.  Musculoskeletal: Negative for back pain, joint pain and myalgias.  Skin: Negative.   Neurological: Negative for dizziness.  Psychiatric/Behavioral: The patient is not nervous/anxious.     Physical Exam  Constitutional: She is oriented to person, place, and time. She appears well-developed and well-nourished. No distress.  Neck: No thyromegaly present.  Cardiovascular: Normal rate, regular rhythm, normal heart sounds and intact distal pulses.  Pulmonary/Chest: Effort normal and breath sounds normal. No respiratory distress.  Abdominal: Soft. Bowel sounds are normal. She exhibits no distension. There is no tenderness.  Musculoskeletal: She exhibits no edema.  Is able to  move all extremities Status post left knee replacement Using walker  Lymphadenopathy:    She has no cervical adenopathy.  Neurological: She is alert and oriented to person, place, and time.  Skin: Skin is warm and dry. She is not diaphoretic.  Incision line without signs of infection   Psychiatric: She has a normal mood and affect.       ASSESSMENT/ PLAN:   Patient is being discharged with the following home health services:  Pt/ot: to evaluate and treat as indicated for gait balance strength adl training.   Patient is being discharged with the following durable medical equipment:  None required   Patient has been advised to f/u with their PCP in 1-2 weeks to bring them up to date on their rehab stay.  Social services at facility was responsible for arranging this appointment.  Pt was provided with a 30 day supply of prescriptions for medications and refills must be obtained from their PCP.  For controlled substances, a more limited supply may be provided adequate until PCP appointment only.  A 30 day supply of her prescription medications as listed above #20 oxycodone 5 mg tabs #20 xanax 0.5 mg tabs.   Time spent with discharge 35 minutes: discussed medications; home health needs; she has all necessary dme. Verbalized understanding.   Ok Edwards NP [facility]  Contact [phone] Monday through Friday 8am- 5pm  After hours call [phone]

## 2018-04-20 NOTE — Telephone Encounter (Signed)
Patient will receive prescription refill for Oxycodone 5 mg tab (1-2 tabs every 4 hours as needed) total 20 tabs upon discharge today 04/20/18.

## 2018-04-21 DIAGNOSIS — I10 Essential (primary) hypertension: Secondary | ICD-10-CM

## 2018-04-21 DIAGNOSIS — F329 Major depressive disorder, single episode, unspecified: Secondary | ICD-10-CM

## 2018-04-21 DIAGNOSIS — F411 Generalized anxiety disorder: Secondary | ICD-10-CM

## 2018-04-21 DIAGNOSIS — G43909 Migraine, unspecified, not intractable, without status migrainosus: Secondary | ICD-10-CM

## 2018-04-21 DIAGNOSIS — K219 Gastro-esophageal reflux disease without esophagitis: Secondary | ICD-10-CM

## 2018-04-21 DIAGNOSIS — M48062 Spinal stenosis, lumbar region with neurogenic claudication: Secondary | ICD-10-CM

## 2018-04-21 DIAGNOSIS — Z471 Aftercare following joint replacement surgery: Secondary | ICD-10-CM

## 2018-04-21 DIAGNOSIS — G250 Essential tremor: Secondary | ICD-10-CM

## 2019-01-05 DIAGNOSIS — R1084 Generalized abdominal pain: Secondary | ICD-10-CM | POA: Insufficient documentation

## 2019-01-21 DIAGNOSIS — N3289 Other specified disorders of bladder: Secondary | ICD-10-CM | POA: Insufficient documentation

## 2019-04-03 DIAGNOSIS — Z8551 Personal history of malignant neoplasm of bladder: Secondary | ICD-10-CM | POA: Insufficient documentation

## 2019-11-05 ENCOUNTER — Ambulatory Visit
Admission: EM | Admit: 2019-11-05 | Discharge: 2019-11-05 | Disposition: A | Discharge status: Home / Self Care | Payer: Medicare Other | Attending: Family Medicine | Admitting: Family Medicine

## 2019-11-05 ENCOUNTER — Other Ambulatory Visit: Payer: Self-pay

## 2019-11-05 ENCOUNTER — Encounter: Payer: Self-pay | Admitting: Emergency Medicine

## 2019-11-05 DIAGNOSIS — R519 Headache, unspecified: Secondary | ICD-10-CM | POA: Diagnosis present

## 2019-11-05 HISTORY — DX: Malignant (primary) neoplasm, unspecified: C80.1

## 2019-11-05 LAB — CBC WITH DIFFERENTIAL/PLATELET
Abs Immature Granulocytes: 0.01 10*3/uL (ref 0.00–0.07)
Basophils Absolute: 0 10*3/uL (ref 0.0–0.1)
Basophils Relative: 0 %
Eosinophils Absolute: 0.2 10*3/uL (ref 0.0–0.5)
Eosinophils Relative: 3 %
HCT: 37.2 % (ref 36.0–46.0)
Hemoglobin: 12.5 g/dL (ref 12.0–15.0)
Immature Granulocytes: 0 %
Lymphocytes Relative: 37 %
Lymphs Abs: 2.6 10*3/uL (ref 0.7–4.0)
MCH: 30 pg (ref 26.0–34.0)
MCHC: 33.6 g/dL (ref 30.0–36.0)
MCV: 89.2 fL (ref 80.0–100.0)
Monocytes Absolute: 0.5 10*3/uL (ref 0.1–1.0)
Monocytes Relative: 6 %
Neutro Abs: 3.7 10*3/uL (ref 1.7–7.7)
Neutrophils Relative %: 54 %
Platelets: 201 10*3/uL (ref 150–400)
RBC: 4.17 MIL/uL (ref 3.87–5.11)
RDW: 13.4 % (ref 11.5–15.5)
WBC: 7 10*3/uL (ref 4.0–10.5)
nRBC: 0 % (ref 0.0–0.2)

## 2019-11-05 LAB — SEDIMENTATION RATE: Sed Rate: 8 mm/hr (ref 0–30)

## 2019-11-05 LAB — BASIC METABOLIC PANEL
Anion gap: 7 (ref 5–15)
BUN: 13 mg/dL (ref 8–23)
CO2: 25 mmol/L (ref 22–32)
Calcium: 8.9 mg/dL (ref 8.9–10.3)
Chloride: 101 mmol/L (ref 98–111)
Creatinine, Ser: 0.75 mg/dL (ref 0.44–1.00)
GFR calc Af Amer: >60 mL/min (ref >60)
GFR calc non Af Amer: >60 mL/min (ref >60)
Glucose, Bld: 104 mg/dL — ABNORMAL HIGH (ref 70–99)
Potassium: 3.5 mmol/L (ref 3.5–5.1)
Sodium: 133 mmol/L — ABNORMAL LOW (ref 135–145)

## 2019-11-05 LAB — C-REACTIVE PROTEIN: CRP: 0.6 mg/dL (ref <1.0)

## 2019-11-05 MED ORDER — PREDNISONE 10 MG PO TABS: 60.0000 mg | ORAL_TABLET | Freq: Every day | ORAL | 0 refills | Status: AC

## 2019-11-05 MED ORDER — ALPRAZOLAM 0.5 MG PO TABS: 0.5000 mg | ORAL_TABLET | Freq: Every evening | ORAL | 0 refills | Status: DC | PRN

## 2019-11-05 NOTE — ED Provider Notes (Addendum)
[facility]    CSN: [id number] Arrival date & time: 11/05/19  4098      History   Chief Complaint Chief Complaint  Patient presents with  . Headache   HPI  79 year old female presents with the above complaint.  Patient reports headache started on Sunday and has continued to persist.  She rates her pain is 8/10 in severity.  She states that the headache is bitemporal.  She also reports pain around the temporomandibular joints bilaterally.  Denies vision change.  Patient does endorse some jaw claudication stating that when she eats jaw feels tired and she has to rest.  No recent fall, trauma, injury.  She has taken Tylenol without relief.  No known relieving factors.  No fever.  No other reported symptoms.  No other complaints.  Past Medical History:  Diagnosis Date  . Anxiety   . Cancer ([place])   . Hypertension   HLD GERD  Patient Active Problem List   Diagnosis Date Noted  . Primary osteoarthritis of left knee 04/20/2018  . Status post left knee replacement 04/20/2018  . Essential hypertension, benign 04/20/2018   Past Surgical History:  Procedure Laterality Date  . CATARACT EXTRACTION Bilateral   . KNEE SURGERY Right    OB History   No obstetric history on file.    Home Medications    Prior to Admission medications   Medication Sig Start Date End Date Taking? Authorizing Provider  amitriptyline (ELAVIL) 25 MG tablet Take 25 mg by mouth at bedtime.    Yes [provider]  aspirin 325 MG EC tablet Take 325 mg by mouth 2 (two) times daily.   Yes [provider]  atorvastatin (LIPITOR) 20 MG tablet Take 20 mg by mouth daily.    Yes [provider]  Cholecalciferol 1000 units tablet Take 2,000 Units by mouth daily.   Yes [provider]  hydrochlorothiazide (HYDRODIURIL) 25 MG tablet Take 25 mg by mouth daily.    Yes [provider]  omeprazole (PRILOSEC) 40 MG capsule Take 40 mg by mouth daily.   Yes [provider]  potassium chloride SA (K-DUR,KLOR-CON) 20 MEQ tablet Take 20 mEq by mouth daily.   Yes [provider]  propranolol (INDERAL) 20 MG tablet Take 20 mg by mouth 2 (two) times daily.   Yes [provider]  venlafaxine (EFFEXOR) 37.5 MG tablet Take 37.5 mg by mouth daily.   Yes [provider]  ALPRAZolam (XANAX) 0.5 MG tablet Take 1 tablet (0.5 mg total) by mouth at bedtime as needed for anxiety or sleep. 11/05/19   Coral Spikes, DO  diclofenac sodium (VOLTAREN) 1 % GEL Apply 2 g topically 4 (four) times daily as needed. apply to affected area as needed for pain    [provider]  ondansetron (ZOFRAN) 4 MG tablet Take 4 mg by mouth every 8 (eight) hours as needed for nausea or vomiting.    [provider]  predniSONE (DELTASONE) 10 MG tablet Take 6 tablets (60 mg total) by mouth daily for 5 days. 11/05/19 11/10/19  Coral Spikes, DO  senna (SENOKOT) 8.6 MG tablet Take 2 tablets by mouth at bedtime as needed for constipation.    [provider]   Social History Social History   Tobacco Use  . Smoking status: Never Smoker  . Smokeless tobacco: Never Used  Substance Use Topics  . Alcohol use: No  . Drug use: No   Allergies   Ciprofloxacin, Codeine,  and Metronidazole  Review of Systems Review of Systems  Eyes: Negative for visual disturbance.  Neurological: Positive for headaches.   Physical Exam Triage Vital Signs ED Triage Vitals  Enc Vitals Group     BP 11/05/19 0910 (!) 162/76     Pulse Rate 11/05/19 0910 (!) 53     Resp 11/05/19 0910 14     Temp 11/05/19 0910 98.4 F (36.9 C)     Temp Source 11/05/19 0910 Oral     SpO2 11/05/19 0910 97 %     Weight 11/05/19 0906 168 lb (76.2 kg)     Height 11/05/19 0906 _0  (1.651 m)     Head Circumference --      Peak Flow --      Pain Score 11/05/19 0905 8     Pain Loc --      Pain Edu? --      Excl. in [place]? --    Updated Vital Signs BP (!) 162/76 (BP Location:  Right Arm)   Pulse (!) 53   Temp 98.4 F (36.9 C) (Oral)   Resp 14   Ht _1  (1.651 m)   Wt 76.2 kg   SpO2 97%   BMI 27.96 kg/m   Visual Acuity Right Eye Distance:   Left Eye Distance:   Bilateral Distance:    Right Eye Near:   Left Eye Near:    Bilateral Near:     Physical Exam Vitals and nursing note reviewed.  Constitutional:      General: She is not in acute distress.    Appearance: Normal appearance. She is not ill-appearing.  HENT:     Head: Normocephalic and atraumatic.     Comments: Patient with clicking/popping of the TMJ with opening of the mouth/jaw.  Patient tender at this area. No appreciable temporal wasting noted. Eyes:     General:        Right eye: No discharge.        Left eye: No discharge.     Conjunctiva/sclera: Conjunctivae normal.  Cardiovascular:     Rate and Rhythm: Regular rhythm. Bradycardia present.  Pulmonary:     Effort: Pulmonary effort is normal.     Breath sounds: Normal breath sounds. No wheezing, rhonchi or rales.  Neurological:     Mental Status: She is alert and oriented to person, place, and time.     Comments: Head tremor noted.  Psychiatric:        Mood and Affect: Mood normal.        Behavior: Behavior normal.    UC Treatments / Results  Labs (all labs ordered are listed, but only abnormal results are displayed) Labs Reviewed  BASIC METABOLIC PANEL - Abnormal; Notable for the following components:      Result Value   Sodium 133 (*)    Glucose, Bld 104 (*)    All other components within normal limits  CBC WITH DIFFERENTIAL/PLATELET  SEDIMENTATION RATE  C-REACTIVE PROTEIN    EKG   Radiology No results found.  Procedures Procedures (including critical care time)  Medications Ordered in UC Medications - No data to display  Initial Impression / Assessment and Plan / UC Course  I have reviewed the triage vital signs and the nursing notes.  Pertinent labs & imaging results that were available during my care  of the patient were reviewed by me and considered in my medical decision making (see chart for details).    79 year old female presents with bitemporal  headache.  Given age, reports of jaw claudication, and location of her headache I am concerned about the possibility of temporal arteritis.  ESR was not elevated.  Awaiting CRP.  CBC and CMP essentially unremarkable.  The etiology and prognosis is unclear at this time.  Placing on high-dose prednisone.  I have arranged for her to have follow-up at her primary care physician's office on 3/31.  May need referral for temporal artery biopsy and continued high-dose steroids.  Patient requested refill on her Xanax today as she is currently out.  I gave her 10 tablets.  She is to follow-up with her PCP.  Final Clinical Impressions(s) / UC Diagnoses   Final diagnoses:  Temporal headache     Discharge Instructions     Medication as prescribed.  Follow up visit on 3/31 @ 10 AM.  Take care  Dr. Lacinda Axon     ED Prescriptions    Medication Sig [place]. Provider   predniSONE (DELTASONE) 10 MG tablet Take 6 tablets (60 mg total) by mouth daily for 5 days. 30 tablet Verl Kitson G, DO   ALPRAZolam (XANAX) 0.5 MG tablet Take 1 tablet (0.5 mg total) by mouth at bedtime as needed for anxiety or sleep. 10 tablet Thersa Salt G, DO     I have reviewed the PDMP during this encounter.    Coral Spikes, [place] 11/05/19 1125

## 2019-11-05 NOTE — Discharge Instructions (Signed)
Medication as prescribed.  Follow up visit on 3/31 @ 10 AM.  Take care  Dr. Lacinda Axon

## 2019-11-05 NOTE — ED Triage Notes (Signed)
Patient c/o headache pain that started on Sunday.  Patient also reports pain on both sides of her facial cheeks.  Patient denies fevers.  Patient states that she has been taking Tylenol and has not helped.

## 2019-11-12 ENCOUNTER — Other Ambulatory Visit: Payer: Self-pay

## 2019-11-12 ENCOUNTER — Encounter: Payer: Self-pay | Admitting: Emergency Medicine

## 2019-11-12 ENCOUNTER — Ambulatory Visit
Admission: EM | Admit: 2019-11-12 | Discharge: 2019-11-12 | Disposition: A | Discharge status: Home / Self Care | Payer: Medicare Other | Attending: Family Medicine | Admitting: Family Medicine

## 2019-11-12 DIAGNOSIS — M542 Cervicalgia: Secondary | ICD-10-CM

## 2019-11-12 DIAGNOSIS — M62838 Other muscle spasm: Secondary | ICD-10-CM

## 2019-11-12 HISTORY — DX: Migraine, unspecified, not intractable, without status migrainosus: G43.909

## 2019-11-12 LAB — TROPONIN I (HIGH SENSITIVITY): Troponin I (High Sensitivity): 3 ng/L (ref <18)

## 2019-11-12 MED ORDER — BACLOFEN 10 MG PO TABS: 5.0000 mg | ORAL_TABLET | Freq: Three times a day (TID) | ORAL | 0 refills | Status: DC | PRN

## 2019-11-12 NOTE — ED Provider Notes (Signed)
[facility]    CSN: [id number] Arrival date & time: 11/12/19  1010      History   Chief Complaint Chief Complaint  Patient presents with  . Jaw Pain  . Arm Pain    left   HPI  79 year old female presents with the above complaints.  Patient recently seen by me on 3/26.  At that time she was having temporal headaches and jaw pain.  Patient was placed on prednisone for concern for possible temporal arteritis.  Patient follow-up with her primary care physician on 3/29.  Note was reviewed.  In summary: Patient had improvement in her headache and jaw pain and prednisone was tapered.  Patient presents today reporting 1 week history of ongoing jaw pain, left-sided neck pain, and left arm pain.  Patient also reports that she has had some intermittent chest pressure.  She has had some shortness of breath when she walks.  Patient reports left neck/left arm pain currently, 7/10 in severity.  Patient also reports numbness of her left thumb.  No relieving factors.  No other reported symptoms.  No other complaints.  Past Medical History:  Diagnosis Date  . Anxiety   . Cancer ([place])   . Hypertension   . Migraine     Patient Active Problem List   Diagnosis Date Noted  . Primary osteoarthritis of left knee 04/20/2018  . Status post left knee replacement 04/20/2018  . Essential hypertension, benign 04/20/2018    Past Surgical History:  Procedure Laterality Date  . CATARACT EXTRACTION Bilateral   . KNEE SURGERY Right     OB History   No obstetric history on file.      Home Medications    Prior to Admission medications   Medication Sig Start Date End Date Taking? Authorizing Provider  ALPRAZolam Duanne Moron) 0.5 MG tablet Take 1 tablet (0.5 mg total) by mouth at bedtime as needed for anxiety or sleep. 11/05/19  Yes Shany Marinez G, DO  amitriptyline (ELAVIL) 25 MG tablet Take 25 mg by mouth at bedtime.    Yes [provider]  aspirin EC 81 MG tablet Take 81 mg by mouth  daily.   Yes [provider]  atorvastatin (LIPITOR) 20 MG tablet Take 20 mg by mouth daily.    Yes [provider]  Cholecalciferol 1000 units tablet Take 2,000 Units by mouth daily.   Yes [provider]  hydrochlorothiazide (HYDRODIURIL) 25 MG tablet Take 25 mg by mouth daily.    Yes [provider]  omeprazole (PRILOSEC) 40 MG capsule Take 40 mg by mouth daily.   Yes [provider]  ondansetron (ZOFRAN) 4 MG tablet Take 4 mg by mouth every 8 (eight) hours as needed for nausea or vomiting.   Yes [provider]  potassium chloride SA (K-DUR,KLOR-CON) 20 MEQ tablet Take 20 mEq by mouth daily.   Yes [provider]  propranolol (INDERAL) 20 MG tablet Take 20 mg by mouth 2 (two) times daily.   Yes [provider]  senna (SENOKOT) 8.6 MG tablet Take 2 tablets by mouth at bedtime as needed for constipation.   Yes [provider]  SUMAtriptan (IMITREX) 50 MG tablet Take 50 mg by mouth 2 (two) times daily as needed. 07/16/19  Yes [provider]  venlafaxine (EFFEXOR) 37.5 MG tablet Take 37.5 mg by mouth daily.   Yes [provider]  baclofen (LIORESAL) 10 MG tablet Take 0.5-1 tablets (5-10 mg total) by mouth 3 (three) times daily as  needed for muscle spasms (Neck pain). 11/12/19   Coral Spikes, DO    Family History Family History  Problem Relation Age of Onset  . Colon cancer Mother   . Heart attack Mother   . Other Father        MVA    Social History Social History   Tobacco Use  . Smoking status: Never Smoker  . Smokeless tobacco: Never Used  Substance Use Topics  . Alcohol use: No  . Drug use: No     Allergies   Ciprofloxacin, Codeine, and Metronidazole   Review of Systems Review of Systems  Cardiovascular: Positive for chest pain.  Musculoskeletal: Positive for neck pain.  Neurological: Positive for numbness.   Physical Exam Triage Vital Signs ED Triage Vitals  Enc Vitals  Group     BP 11/12/19 1018 (!) 182/78     Pulse Rate 11/12/19 1018 61     Resp 11/12/19 1018 18     Temp 11/12/19 1018 97.7 F (36.5 C)     Temp Source 11/12/19 1018 Oral     SpO2 11/12/19 1018 100 %     Weight 11/12/19 1019 168 lb (76.2 kg)     Height 11/12/19 1019 5\' 5"  (1.651 m)     Head Circumference --      Peak Flow --      Pain Score 11/12/19 1018 7     Pain Loc --      Pain Edu? --      Excl. in [place]? --    Updated Vital Signs BP (!) 182/78 (BP Location: Left Arm)   Pulse 61   Temp 97.7 F (36.5 C) (Oral)   Resp 18   Ht 5\' 5"  (1.651 m)   Wt 76.2 kg   SpO2 100%   BMI 27.96 kg/m   Visual Acuity Right Eye Distance:   Left Eye Distance:   Bilateral Distance:    Right Eye Near:   Left Eye Near:    Bilateral Near:     Physical Exam Vitals and nursing note reviewed.  Constitutional:      General: She is not in acute distress.    Appearance: Normal appearance. She is not ill-appearing.  HENT:     Head: Normocephalic and atraumatic.  Eyes:     General:        Right eye: No discharge.        Left eye: No discharge.     Conjunctiva/sclera: Conjunctivae normal.  Cardiovascular:     Rate and Rhythm: Normal rate and regular rhythm.     Heart sounds: No murmur.  Pulmonary:     Effort: Pulmonary effort is normal.     Breath sounds: Normal breath sounds. No wheezing, rhonchi or rales.  Musculoskeletal:     Comments: Tightness/spasm of the left trapezius.  Tenderness to palpation.  Neurological:     Mental Status: She is alert.  Psychiatric:        Mood and Affect: Mood normal.        Behavior: Behavior normal.    UC Treatments / Results  Labs (all labs ordered are listed, but only abnormal results are displayed) Labs Reviewed  TROPONIN I (HIGH SENSITIVITY)    EKG Interpretation: Normal sinus rhythm with rate of 60.  Normal intervals.  T wave inversion in aVL.    Radiology No results found.  Procedures Procedures (including critical care  time)  Medications Ordered in UC Medications - No data to display  Initial Impression / Assessment and Plan / UC Course  I have reviewed the triage vital signs and the nursing notes.  Pertinent labs & imaging results that were available during my care of the patient were reviewed by me and considered in my medical decision making (see chart for details).    79 year old female presents with neck pain and left arm pain as well as ongoing jaw pain.  Patient also endorsed chest pain.  EKG nonischemic today.  Troponin negative.  Patient's pain appears to be predominantly muscular nature.  Treating with baclofen and Tylenol.  Supportive care.  Final Clinical Impressions(s) / UC Diagnoses   Final diagnoses:  Neck pain  Trapezius muscle spasm     Discharge Instructions     Rest.  Heat.  Tylenol 1000 mg 3 times daily as needed for pain.  Use the Baclofen as prescribed.  Follow up with your PCP.  Take care  Dr. Lacinda Axon     ED Prescriptions    Medication Sig Dispense Auth. Provider   baclofen (LIORESAL) 10 MG tablet Take 0.5-1 tablets (5-10 mg total) by mouth 3 (three) times daily as needed for muscle spasms (Neck pain). 15 each Coral Spikes, DO     PDMP not reviewed this encounter.   Coral Spikes, [place] 11/12/19 1156

## 2019-11-12 NOTE — Discharge Instructions (Signed)
Rest.  Heat.  Tylenol 1000 mg 3 times daily as needed for pain.  Use the Baclofen as prescribed.  Follow up with your PCP.  Take care  Dr. Lacinda Axon

## 2019-11-12 NOTE — ED Triage Notes (Signed)
Patient in today c/o jaw pain, left sided neck pain and left arm pain x 1 week. Patient was seen at [facility] 11/05/19 for facial pain and given steroids. Patient states her face/jaw pain is improving, but neck and arm pain have been getting worse. Patient denies nausea, emesis. Patient states she has some chest pain and get sob when she walks.

## 2019-12-04 ENCOUNTER — Ambulatory Visit
Admission: EM | Admit: 2019-12-04 | Discharge: 2019-12-04 | Disposition: A | Discharge status: Home / Self Care | Payer: Medicare Other | Attending: Family Medicine | Admitting: Family Medicine

## 2019-12-04 ENCOUNTER — Encounter: Payer: Self-pay | Admitting: Emergency Medicine

## 2019-12-04 ENCOUNTER — Ambulatory Visit: Payer: Medicare Other

## 2019-12-04 ENCOUNTER — Other Ambulatory Visit: Payer: Self-pay

## 2019-12-04 DIAGNOSIS — M19071 Primary osteoarthritis, right ankle and foot: Secondary | ICD-10-CM | POA: Diagnosis not present

## 2019-12-04 NOTE — Discharge Instructions (Signed)
-  Continue taking Tylenol as needed for pain. -Wear loose fitting shoes to decrease pressure on foot. -Purchase OTC Voltaren gel to use on the right foot. -Follow-up with PCP if continued pain. -Information for Podiatry provided as well.

## 2019-12-04 NOTE — ED Triage Notes (Signed)
Patient c/o right foot pain that started yesterday.  Patient states that she has a cyst on her right foot.  Patient denies fall or injury.  Patient denies fevers.

## 2019-12-04 NOTE — ED Provider Notes (Signed)
[facility]    CSN: [id number] Arrival date & time: 12/04/19  0945      History   Chief Complaint Chief Complaint  Patient presents with  . Foot Pain    right    HPI Paula Daniels is a 79 y.o. female who presents today for evaluation of right lateral foot pain.  The patient has a history of arthritic changes to bilateral hands and she is also seen for multiple areas of osteoarthritic changes by orthopedics.  She states that she has had a small cyst along the dorsal, lateral aspect of the foot for several years however over the past day she reports increased discomfort along the dorsal, lateral aspect of the foot.  She denies any fevers or chills, she denies any redness of the right foot.  She denies any numbness or tingling to the right lower extremity.  She is status post bilateral knee total knee replacements, she wanted to be evaluated to make sure that there is no evidence of infection to the right foot.  HPI  Past Medical History:  Diagnosis Date  . Anxiety   . Cancer ([place])   . Hypertension   . Migraine     Patient Active Problem List   Diagnosis Date Noted  . Primary osteoarthritis of left knee 04/20/2018  . Status post left knee replacement 04/20/2018  . Essential hypertension, benign 04/20/2018    Past Surgical History:  Procedure Laterality Date  . CATARACT EXTRACTION Bilateral   . KNEE SURGERY Right     OB History   No obstetric history on file.      Home Medications    Prior to Admission medications   Medication Sig Start Date End Date Taking? Authorizing Provider  ALPRAZolam Duanne Moron) 0.5 MG tablet Take 1 tablet (0.5 mg total) by mouth at bedtime as needed for anxiety or sleep. 11/05/19  Yes Cook, Jayce G, DO  amitriptyline (ELAVIL) 25 MG tablet Take 25 mg by mouth at bedtime.    Yes [provider]  aspirin EC 81 MG tablet Take 81 mg by mouth daily.   Yes [provider]  atorvastatin (LIPITOR) 20 MG tablet Take 20 mg  by mouth daily.    Yes [provider]  baclofen (LIORESAL) 10 MG tablet Take 0.5-1 tablets (5-10 mg total) by mouth 3 (three) times daily as needed for muscle spasms (Neck pain). 11/12/19  Yes Coral Spikes, DO  Cholecalciferol 1000 units tablet Take 2,000 Units by mouth daily.   Yes [provider]  cyclobenzaprine (FLEXERIL) 5 MG tablet Take 5 mg by mouth 2 (two) times daily as needed. 11/24/19  Yes [provider]  gabapentin (NEURONTIN) 300 MG capsule Take by mouth. 11/24/19 12/24/19 Yes [provider]  hydrochlorothiazide (HYDRODIURIL) 25 MG tablet Take 25 mg by mouth daily.    Yes [provider]  omeprazole (PRILOSEC) 40 MG capsule Take 40 mg by mouth daily.   Yes [provider]  ondansetron (ZOFRAN) 4 MG tablet Take 4 mg by mouth every 8 (eight) hours as needed for nausea or vomiting.   Yes [provider]  potassium chloride SA (K-DUR,KLOR-CON) 20 MEQ tablet Take 20 mEq by mouth daily.   Yes [provider]  propranolol (INDERAL) 20 MG tablet Take 20 mg by mouth 2 (two) times daily.   Yes [provider]  senna (SENOKOT) 8.6 MG tablet Take 2 tablets by mouth at bedtime as needed for constipation.   Yes [provider]  SUMAtriptan (IMITREX) 50 MG tablet Take 50 mg by mouth 2 (two) times daily as needed. 07/16/19  Yes [provider]  venlafaxine (EFFEXOR) 37.5 MG tablet Take 37.5 mg by mouth daily.   Yes [provider]    Family History Family History  Problem Relation Age of Onset  . Colon cancer Mother   . Heart attack Mother   . Other Father        MVA    Social History Social History   Tobacco Use  . Smoking status: Never Smoker  . Smokeless tobacco: Never Used  Substance Use Topics  . Alcohol use: No  . Drug use: No     Allergies   Ciprofloxacin, Codeine, and Metronidazole   Review of Systems Review of Systems  Musculoskeletal: Positive for arthralgias.    Skin: Negative for color change and pallor.  All other systems reviewed and are negative.    Physical Exam Triage Vital Signs ED Triage Vitals  Enc Vitals Group     BP 12/04/19 0959 (!) 142/69     Pulse Rate 12/04/19 0959 (!) 57     Resp 12/04/19 0959 14     Temp 12/04/19 0959 97.6 F (36.4 C)     Temp Source 12/04/19 0959 Oral     SpO2 12/04/19 0959 96 %     Weight 12/04/19 0955 166 lb (75.3 kg)     Height 12/04/19 0955 5\' 5"  (1.651 m)     Head Circumference --      Peak Flow --      Pain Score 12/04/19 0955 6     Pain Loc --      Pain Edu? --      Excl. in [place]? --    No data found.  Updated Vital Signs BP (!) 142/69 (BP Location: Left Arm)   Pulse (!) 57   Temp 97.6 F (36.4 C) (Oral)   Resp 14   Ht 5\' 5"  (1.651 m)   Wt 166 lb (75.3 kg)   SpO2 96%   BMI 27.62 kg/m   Visual Acuity Right Eye Distance:   Left Eye Distance:   Bilateral Distance:    Right Eye Near:   Left Eye Near:    Bilateral Near:     Physical Exam Skin examination of the right foot reveals no open wound, erythema or ecchymosis.  Does appear to be a small cyst along the proximal aspect of the fifth metatarsal.  The patient is able to flex and extend all toes without significant discomfort.  She is tender to palpation at the base of the fifth metatarsal at the cuboid.  The patient is able to fully invert and evert the right foot without discomfort.  No pain with dorsiflexion or plantar flexion of the right ankle.  She is intact light touch to the right lower extremity.  2+ dorsalis pedis pulse to the right foot.  UC Treatments / Results  Labs (all labs ordered are listed, but only abnormal results are displayed) Labs Reviewed - No data to display  EKG   Radiology DG Foot Complete Right  Result Date: 12/04/2019 CLINICAL DATA:  79 year old female with acute LATERAL RIGHT foot pain. No known injury. Initial encounter. EXAM: RIGHT FOOT COMPLETE - 3+ VIEW COMPARISON:  None FINDINGS: No acute  fracture, subluxation or dislocation noted. No suspicious focal bony lesions are present. A small to moderate plantar calcaneal spur is present. No other significant abnormalities are noted. IMPRESSION: 1. No evidence  of acute bony abnormality. 2. Small to moderate plantar calcaneal spur. Electronically Signed   By: Margarette Canada M.D.   On: 12/04/2019 10:44    Procedures Procedures (including critical care time)  Medications Ordered in UC Medications - No data to display  Initial Impression / Assessment and Plan / UC Course  I have reviewed the triage vital signs and the nursing notes.  Pertinent labs & imaging results that were available during my care of the patient were reviewed by me and considered in my medical decision making (see chart for details).     1.  Treatment options were discussed today with the patient. 2.  X-rays of the right foot demonstrate no acute fracture but do demonstrate degenerative changes with bone spur formation. 3.  She will continue to take Tylenol as needed for discomfort at this time, encouraged the patient to purchase Voltaren gel to begin the use to the right foot. 4.  Instructed the patient on the importance of him wearing loose fitting shoes while the foot is irritated. 5.  Follow-up as needed, information for local podiatrist given to the patient. Final Clinical Impressions(s) / UC Diagnoses   Final diagnoses:  Primary osteoarthritis of right foot     Discharge Instructions     -Continue taking Tylenol as needed for pain. -Wear loose fitting shoes to decrease pressure on foot. -Purchase OTC Voltaren gel to use on the right foot. -Follow-up with PCP if continued pain. -Information for Podiatry provided as well.    ED Prescriptions    None     PDMP not reviewed this encounter.   Lattie Corns, PA-C 12/04/19 1048

## 2020-01-10 ENCOUNTER — Other Ambulatory Visit: Payer: Self-pay

## 2020-01-10 ENCOUNTER — Ambulatory Visit
Admission: EM | Admit: 2020-01-10 | Discharge: 2020-01-10 | Disposition: A | Discharge status: Home / Self Care | Payer: Medicare Other | Attending: Family Medicine | Admitting: Family Medicine

## 2020-01-10 DIAGNOSIS — S0501XA Injury of conjunctiva and corneal abrasion without foreign body, right eye, initial encounter: Secondary | ICD-10-CM

## 2020-01-10 MED ORDER — SULFACETAMIDE SODIUM 10 % OP SOLN: 2.0000 [drp] | Freq: Four times a day (QID) | OPHTHALMIC | 0 refills | Status: DC

## 2020-01-10 NOTE — ED Triage Notes (Signed)
Pt c/o pain to right eye starting this AM.

## 2020-01-10 NOTE — ED Provider Notes (Signed)
[facility]    CSN: [id number] Arrival date & time: 01/10/20  [id number]      History   Chief Complaint Chief Complaint  Patient presents with  . Eye Pain    HPI Paula Daniels is a 79 y.o. female.   80 yo female with a c/o sensation of "something in my right eye". States she woke up this morning with a discomfort of her right eye and a sensation like there was something in it. Denies any specific injury, fevers, chills, headaches, vision changes, double vision, loss of vision.    Eye Pain    Past Medical History:  Diagnosis Date  . Anxiety   . Cancer ([place])   . Hypertension   . Migraine     Patient Active Problem List   Diagnosis Date Noted  . Primary osteoarthritis of left knee 04/20/2018  . Status post left knee replacement 04/20/2018  . Essential hypertension, benign 04/20/2018    Past Surgical History:  Procedure Laterality Date  . CATARACT EXTRACTION Bilateral   . KNEE SURGERY Right     OB History   No obstetric history on file.      Home Medications    Prior to Admission medications   Medication Sig Start Date End Date Taking? Authorizing Provider  ALPRAZolam Duanne Moron) 0.5 MG tablet Take 1 tablet (0.5 mg total) by mouth at bedtime as needed for anxiety or sleep. 11/05/19   Coral Spikes, DO  amitriptyline (ELAVIL) 25 MG tablet Take 25 mg by mouth at bedtime.     [provider]  aspirin EC 81 MG tablet Take 81 mg by mouth daily.    [provider]  atorvastatin (LIPITOR) 20 MG tablet Take 20 mg by mouth daily.     [provider]  baclofen (LIORESAL) 10 MG tablet Take 0.5-1 tablets (5-10 mg total) by mouth 3 (three) times daily as needed for muscle spasms (Neck pain). 11/12/19   Coral Spikes, DO  Cholecalciferol 1000 units tablet Take 2,000 Units by mouth daily.    [provider]  cyclobenzaprine (FLEXERIL) 5 MG tablet Take 5 mg by mouth 2 (two) times daily as needed. 11/24/19   [provider]    gabapentin (NEURONTIN) 300 MG capsule Take by mouth. 11/24/19 12/24/19  [provider]  hydrochlorothiazide (HYDRODIURIL) 25 MG tablet Take 25 mg by mouth daily.     [provider]  omeprazole (PRILOSEC) 40 MG capsule Take 40 mg by mouth daily.    [provider]  ondansetron (ZOFRAN) 4 MG tablet Take 4 mg by mouth every 8 (eight) hours as needed for nausea or vomiting.    [provider]  potassium chloride SA (K-DUR,KLOR-CON) 20 MEQ tablet Take 20 mEq by mouth daily.    [provider]  propranolol (INDERAL) 20 MG tablet Take 20 mg by mouth 2 (two) times daily.    [provider]  senna (SENOKOT) 8.6 MG tablet Take 2 tablets by mouth at bedtime as needed for constipation.    [provider]  sulfacetamide (BLEPH-10) 10 % ophthalmic solution Place 2 drops into the right eye 4 (four) times daily. 01/10/20   Norval Gable, MD  SUMAtriptan (IMITREX) 50 MG tablet Take 50 mg by mouth 2 (two) times daily as needed. 07/16/19   [provider]  venlafaxine (EFFEXOR) 37.5 MG tablet Take 37.5 mg by mouth daily.    [provider]    Family History Family History  Problem Relation  Age of Onset  . Colon cancer Mother   . Heart attack Mother   . Other Father        MVA    Social History Social History   Tobacco Use  . Smoking status: Never Smoker  . Smokeless tobacco: Never Used  Substance Use Topics  . Alcohol use: No  . Drug use: No     Allergies   Ciprofloxacin, Codeine, and Metronidazole   Review of Systems Review of Systems  Eyes: Positive for pain.     Physical Exam Triage Vital Signs ED Triage Vitals [01/10/20 1001]  Enc Vitals Group     BP (!) 148/74     Pulse Rate (!) 58     Resp 16     Temp (!) 97 F (36.1 C)     Temp src      SpO2 96 %     Weight      Height      Head Circumference      Peak Flow      Pain Score 8     Pain Loc      Pain Edu?      Excl. in [place]?    No data  found.  Updated Vital Signs BP (!) 148/74   Pulse (!) 58   Temp (!) 97 F (36.1 C)   Resp 16   SpO2 96%   Visual Acuity Right Eye Distance:   Left Eye Distance:   Bilateral Distance:    Right Eye Near:   Left Eye Near:    Bilateral Near:     Physical Exam Vitals and nursing note reviewed.  Constitutional:      General: She is not in acute distress.    Appearance: She is not toxic-appearing or diaphoretic.  Eyes:     General: Lids are normal. Lids are everted, no foreign bodies appreciated. Vision grossly intact.        Right eye: No discharge.        Left eye: No discharge.     Extraocular Movements: Extraocular movements intact.     Conjunctiva/sclera:     Right eye: Right conjunctiva is injected (mildly).     Pupils: Pupils are equal, round, and reactive to light.     Right eye: Corneal abrasion and fluorescein uptake present.   Neurological:     Mental Status: She is alert.      UC Treatments / Results  Labs (all labs ordered are listed, but only abnormal results are displayed) Labs Reviewed - No data to display  EKG   Radiology No results found.  Procedures Procedures (including critical care time)  Medications Ordered in UC Medications - No data to display  Initial Impression / Assessment and Plan / UC Course  I have reviewed the triage vital signs and the nursing notes.  Pertinent labs & imaging results that were available during my care of the patient were reviewed by me and considered in my medical decision making (see chart for details).     Final Clinical Impressions(s) / UC Diagnoses   Final diagnoses:  Abrasion of right cornea, initial encounter    ED Prescriptions    Medication Sig Dispense Auth. Provider   sulfacetamide (BLEPH-10) 10 % ophthalmic solution Place 2 drops into the right eye 4 (four) times daily. 5 mL Norval Gable, MD     1. diagnosis reviewed with patient 2. rx as per orders above; reviewed possible side  effects, interactions, risks and benefits  3. Recommend supportive treatment with otc analgesics prn 4. Follow up with eye doctor this week  5. Follow-up prn  PDMP not reviewed this encounter.   Norval Gable, MD 01/10/20 1054

## 2020-03-17 DIAGNOSIS — M5030 Other cervical disc degeneration, unspecified cervical region: Secondary | ICD-10-CM | POA: Insufficient documentation

## 2020-03-17 DIAGNOSIS — K5900 Constipation, unspecified: Secondary | ICD-10-CM | POA: Insufficient documentation

## 2020-03-17 DIAGNOSIS — E785 Hyperlipidemia, unspecified: Secondary | ICD-10-CM | POA: Insufficient documentation

## 2020-05-14 IMAGING — CR DG FOOT COMPLETE 3+V*R*
3 series · 3 of 3 positions shown · non-contrast
Comparison: None

CLINICAL DATA: 78-year-old female with acute LATERAL RIGHT foot
pain. No known injury. Initial encounter.

EXAM:
RIGHT FOOT COMPLETE - 3+ VIEW

[foot ap]
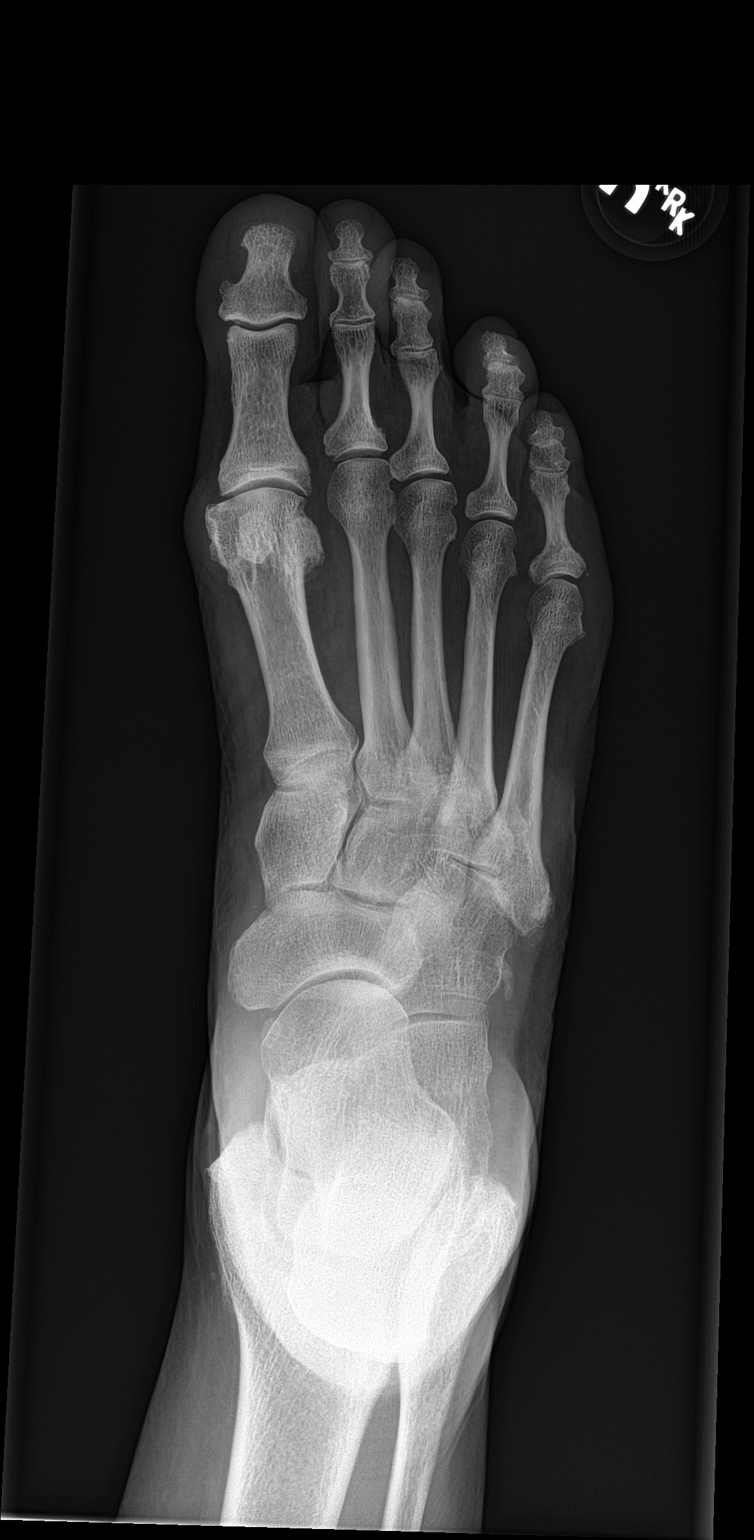

[foot obl]
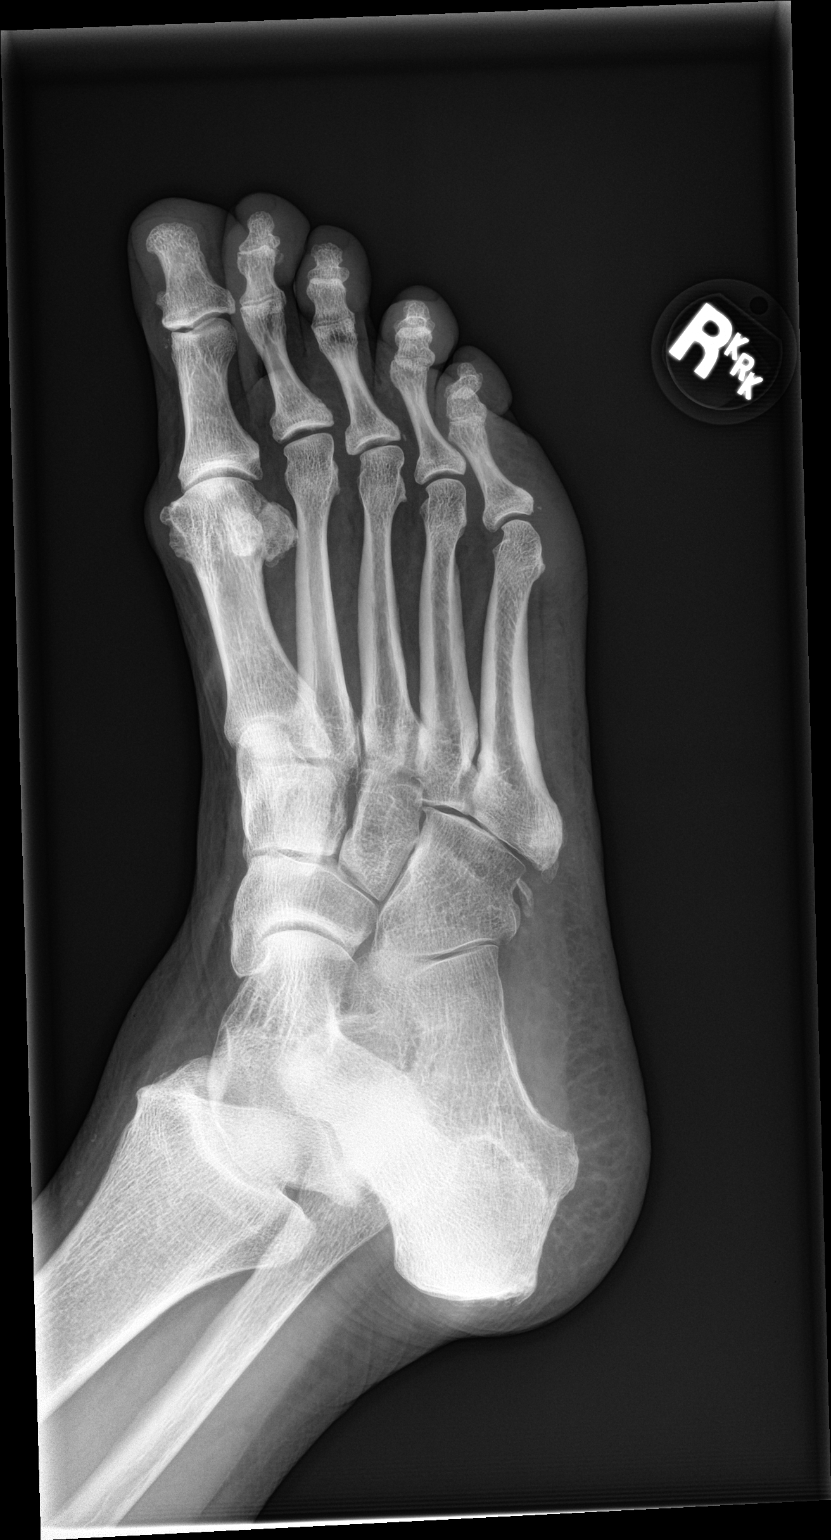

[foot lat]
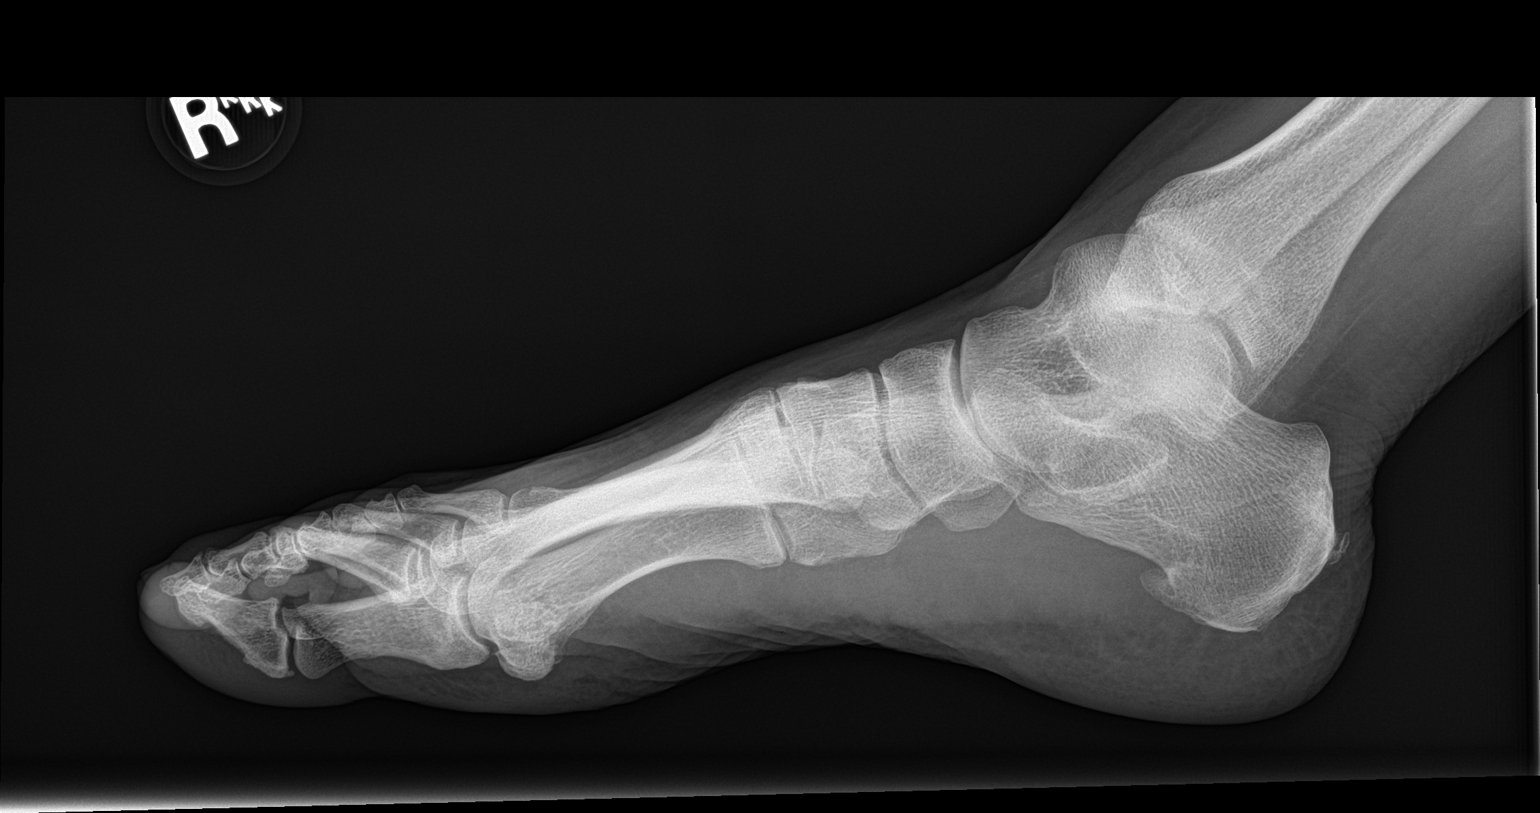

[3 of 3 positions shown; findings below may reference images not displayed]

FINDINGS: No acute fracture, subluxation or dislocation noted.

No suspicious focal bony lesions are present.

A small to moderate plantar calcaneal spur is present.

No other significant abnormalities are noted.
IMPRESSION: 1. No evidence of acute bony abnormality.
2. Small to moderate plantar calcaneal spur.

## 2020-11-14 ENCOUNTER — Other Ambulatory Visit: Payer: Self-pay

## 2020-11-16 ENCOUNTER — Ambulatory Visit: Payer: Medicare Other | Admitting: Gastroenterology

## 2020-11-17 ENCOUNTER — Ambulatory Visit: Payer: Medicare Other | Admitting: Gastroenterology

## 2020-11-27 DIAGNOSIS — Z79899 Other long term (current) drug therapy: Secondary | ICD-10-CM | POA: Insufficient documentation

## 2020-11-27 DIAGNOSIS — G4700 Insomnia, unspecified: Secondary | ICD-10-CM | POA: Insufficient documentation

## 2020-12-22 ENCOUNTER — Encounter (INDEPENDENT_AMBULATORY_CARE_PROVIDER_SITE_OTHER): Payer: Self-pay

## 2020-12-22 ENCOUNTER — Encounter: Payer: Self-pay | Admitting: Gastroenterology

## 2020-12-22 ENCOUNTER — Other Ambulatory Visit: Payer: Self-pay

## 2020-12-22 ENCOUNTER — Ambulatory Visit: Payer: Medicare Other | Admitting: Gastroenterology

## 2020-12-22 VITALS — BP 157/68 | HR 56 | Temp 97.7°F | Ht 65.0 in | Wt 164.5 lb

## 2020-12-22 DIAGNOSIS — K5730 Diverticulosis of large intestine without perforation or abscess without bleeding: Secondary | ICD-10-CM | POA: Diagnosis not present

## 2020-12-22 DIAGNOSIS — K5904 Chronic idiopathic constipation: Secondary | ICD-10-CM | POA: Diagnosis not present

## 2020-12-22 NOTE — Patient Instructions (Addendum)
1. Take magnesium citrate today.  2. Tomorrow start taking Miralax 1 cup full one to two times  A day    High-Fiber Eating Plan Fiber, also called dietary fiber, is a type of carbohydrate. It is found foods such as fruits, vegetables, whole grains, and beans. A high-fiber diet can have many health benefits. Your health care provider may recommend a high-fiber diet to help:  Prevent constipation. Fiber can make your bowel movements more regular.  Lower your cholesterol.  Relieve the following conditions: ? Inflammation of veins in the anus (hemorrhoids). ? Inflammation of specific areas of the digestive tract (uncomplicated diverticulosis). ? A problem of the large intestine, also called the colon, that sometimes causes pain and diarrhea (irritable bowel syndrome, or IBS).  Prevent overeating as part of a weight-loss plan.  Prevent heart disease, type 2 diabetes, and certain cancers. What are tips for following this plan? Reading food labels  Check the nutrition facts label on food products for the amount of dietary fiber. Choose foods that have 5 grams of fiber or more per serving.  The goals for recommended daily fiber intake include: ? Men (age 39 or younger): 34-38 g. ? Men (over age 26): 28-34 g. ? Women (age 52 or younger): 25-28 g. ? Women (over age 57): 22-25 g. Your daily fiber goal is _____________ g.   Shopping  Choose whole fruits and vegetables instead of processed forms, such as apple juice or applesauce.  Choose a wide variety of high-fiber foods such as avocados, lentils, oats, and kidney beans.  Read the nutrition facts label of the foods you choose. Be aware of foods with added fiber. These foods often have high sugar and sodium amounts per serving. Cooking  Use whole-grain flour for baking and cooking.  Cook with brown rice instead of white rice. Meal planning  Start the day with a breakfast that is high in fiber, such as a cereal that contains 5 g of  fiber or more per serving.  Eat breads and cereals that are made with whole-grain flour instead of refined flour or white flour.  Eat brown rice, bulgur wheat, or millet instead of white rice.  Use beans in place of meat in soups, salads, and pasta dishes.  Be sure that half of the grains you eat each day are whole grains. General information  You can get the recommended daily intake of dietary fiber by: ? Eating a variety of fruits, vegetables, grains, nuts, and beans. ? Taking a fiber supplement if you are not able to take in enough fiber in your diet. It is better to get fiber through food than from a supplement.  Gradually increase how much fiber you consume. If you increase your intake of dietary fiber too quickly, you may have bloating, cramping, or gas.  Drink plenty of water to help you digest fiber.  Choose high-fiber snacks, such as berries, raw vegetables, nuts, and popcorn. What foods should I eat? Fruits Berries. Pears. Apples. Oranges. Avocado. Prunes and raisins. Dried figs. Vegetables Sweet potatoes. Spinach. Kale. Artichokes. Cabbage. Broccoli. Cauliflower. Green peas. Carrots. Squash. Grains Whole-grain breads. Multigrain cereal. Oats and oatmeal. Brown rice. Barley. Bulgur wheat. [place]. Quinoa. Bran muffins. Popcorn. Rye wafer crackers. Meats and other proteins Navy beans, kidney beans, and pinto beans. Soybeans. Split peas. Lentils. Nuts and seeds. Dairy Fiber-fortified yogurt. Beverages Fiber-fortified soy milk. Fiber-fortified orange juice. Other foods Fiber bars. The items listed above may not be a complete list of recommended foods and beverages. Contact  a dietitian for more information. What foods should I avoid? Fruits Fruit juice. Cooked, strained fruit. Vegetables Fried potatoes. Canned vegetables. Well-cooked vegetables. Grains White bread. Pasta made with refined flour. White rice. Meats and other proteins Fatty cuts of meat. Fried chicken or  fried fish. Dairy Milk. Yogurt. Cream cheese. Sour cream. Fats and oils Butters. Beverages Soft drinks. Other foods Cakes and pastries. The items listed above may not be a complete list of foods and beverages to avoid. Talk with your dietitian about what choices are best for you. Summary  Fiber is a type of carbohydrate. It is found in foods such as fruits, vegetables, whole grains, and beans.  A high-fiber diet has many benefits. It can help to prevent constipation, lower blood cholesterol, aid weight loss, and reduce your risk of heart disease, diabetes, and certain cancers.  Increase your intake of fiber gradually. Increasing fiber too quickly may cause cramping, bloating, and gas. Drink plenty of water while you increase the amount of fiber you consume.  The best sources of fiber include whole fruits and vegetables, whole grains, nuts, seeds, and beans. This information is not intended to replace advice given to you by your health care provider. Make sure you discuss any questions you have with your health care provider. Document Revised: 12/02/2019 Document Reviewed: 12/02/2019 Elsevier Patient Education  2021 [employer].

## 2020-12-22 NOTE — Progress Notes (Signed)
Cephas Darby, MD [address]  [place]  [place], [place] [address]  Main: [phone]  Fax: [phone]    Gastroenterology Consultation  Referring Provider:     Danae Orleans, MD Primary Care Physician:  Danae Orleans, MD Primary Gastroenterologist:  Dr. Cephas Darby Reason for Consultation:     Abdominal pain and bloating        HPI:   Paula Daniels is a 80 y.o. female referred by Dr. Danae Orleans, MD  for consultation & management of abdominal pain and bloating.  Patient is here for follow-up of approximately 2 months history of progressively worsening generalized abdominal discomfort and abdominal bloating associated with severe constipation.  Patient reports that she has constipation for several years, has alternating episodes of constipation and diarrhea.  Patient has been taking laxatives, which are no longer effective.  Patient went to [facility] ER on 10/30/2020 secondary to severe abdominal pain, underwent CT abdomen pelvis with IV contrast which revealed colonic diverticulosis only.  Labs were otherwise unremarkable. Patient is accompanied by her daughter today.  Patient lives alone, independent of ADLs, socializes regularly.  She does admit to not eating healthy.  Her diet is devoid of fiber.  She does not like to cook just for herself.  She does not smoke or drink alcohol  NSAIDs: None  Antiplts/Anticoagulants/Anti thrombotics: None  GI Procedures:  Colonoscopy 01/18/2019 at [facility] Impression:    - Diverticulosis in the recto-sigmoid colon, in the           sigmoid colon and in the descending colon.           - Diverticulosis in the ascending colon.           - Stool in the entire examined colon.           - One 5 mm polyp at the recto-sigmoid colon, removed with           a hot snare. Resected and retrieved.           - Two 4 to 5 mm polyps in the descending colon, in the            proximal descending colon and in the distal descending           colon, removed with a cold snare. Complete resection.           Partial retrieval.          - Internal hemorrhoids.           - The examined portion of the ileum was normal.           No source of pain identified.   A: Colon, left, biopsy - Adenomatous polyp (multiple fragments), no high-grade dysplasia identified  Past Medical History:  Diagnosis Date  . Anxiety   . Cancer ([place])   . Hypertension   . Migraine     Past Surgical History:  Procedure Laterality Date  . CATARACT EXTRACTION Bilateral   . KNEE SURGERY Right     Current Outpatient Medications:  .  acetaminophen (TYLENOL) 500 MG tablet, Take 1 tablet by mouth daily., Disp: , Rfl:  .  ALPRAZolam (XANAX) 0.5 MG tablet, Take 1 tablet (0.5 mg total) by mouth at bedtime as needed for anxiety or sleep., Disp: 10 tablet, Rfl: 0 .  amitriptyline (ELAVIL) 25 MG tablet, Take 25 mg by mouth at bedtime. , Disp: , Rfl:  .  atorvastatin (LIPITOR) 20 MG tablet, Take 20  mg by mouth daily. , Disp: , Rfl:  .  baclofen (LIORESAL) 10 MG tablet, Take 0.5-1 tablets (5-10 mg total) by mouth 3 (three) times daily as needed for muscle spasms (Neck pain)., Disp: 30 each, Rfl: 0 .  Cholecalciferol 1000 units tablet, Take 2,000 Units by mouth daily., Disp: , Rfl:  .  hydrochlorothiazide (HYDRODIURIL) 25 MG tablet, Take 25 mg by mouth daily. , Disp: , Rfl:  .  omeprazole (PRILOSEC) 40 MG capsule, Take 40 mg by mouth daily., Disp: , Rfl:  .  ondansetron (ZOFRAN) 4 MG tablet, Take 4 mg by mouth every 8 (eight) hours as needed for nausea or vomiting., Disp: , Rfl:  .  potassium chloride SA (K-DUR,KLOR-CON) 20 MEQ tablet, Take 20 mEq by mouth daily., Disp: , Rfl:  .  propranolol (INDERAL) 20 MG tablet, Take 20 mg by mouth 2 (two) times daily., Disp: , Rfl:  .  SUMAtriptan (IMITREX) 50 MG tablet, Take 50 mg by mouth 2 (two) times daily as  needed., Disp: , Rfl:  .  venlafaxine (EFFEXOR) 37.5 MG tablet, Take 37.5 mg by mouth daily., Disp: , Rfl:  .  gabapentin (NEURONTIN) 300 MG capsule, Take by mouth., Disp: , Rfl:    Family History  Problem Relation Age of Onset  . Colon cancer Mother   . Heart attack Mother   . Other Father        MVA     Social History   Tobacco Use  . Smoking status: Never Smoker  . Smokeless tobacco: Never Used  Vaping Use  . Vaping Use: Never used  Substance Use Topics  . Alcohol use: No  . Drug use: No    Allergies as of 12/22/2020 - Review Complete 12/22/2020  Allergen Reaction Noted  . Ciprofloxacin Itching and Rash 01/18/2014  . Codeine Nausea Only and Nausea And Vomiting 12/03/2012  . Metronidazole Rash and Itching 01/18/2014  . Sumatriptan  07/17/2020    Review of Systems:    All systems reviewed and negative except where noted in HPI.   Physical Exam:  BP (!) 157/68 (BP Location: Left Arm, Patient Position: Sitting, Cuff Size: Normal)   Pulse (!) 56   Temp 97.7 F (36.5 C) (Oral)   Ht 5\' 5"  (1.651 m)   Wt 164 lb 8 oz (74.6 kg)   BMI 27.37 kg/m  No LMP recorded. Patient has had a hysterectomy.  General:   Alert,  Well-developed, well-nourished, pleasant and cooperative in NAD Head:  Normocephalic and atraumatic. Eyes:  Sclera clear, no icterus.   Conjunctiva pink. Ears:  Normal auditory acuity. Nose:  No deformity, discharge, or lesions. Mouth:  No deformity or lesions,oropharynx pink & moist. Neck:  Supple; no masses or thyromegaly. Lungs:  Respirations even and unlabored.  Clear throughout to auscultation.   No wheezes, crackles, or rhonchi. No acute distress. Heart:  Regular rate and rhythm; no murmurs, clicks, rubs, or gallops. Abdomen:  Normal bowel sounds. Soft, non-tender and diffusely distended, tympanic to percussion without masses, hepatosplenomegaly or hernias noted.  No guarding or rebound tenderness.   Rectal: Not performed Msk:  Symmetrical without  gross deformities. Good, equal movement & strength bilaterally. Pulses:  Normal pulses noted. Extremities:  No clubbing or edema.  No cyanosis. Neurologic:  Alert and oriented x3;  grossly normal neurologically. Skin:  Intact without significant lesions or rashes. No jaundice. Psych:  Alert and cooperative. Normal mood and affect.  Imaging Studies: Reviewed  Assessment and Plan:   Paula Mancinelli  Daniels is a 80 y.o. pleasant Caucasian female with history of anxiety, migraine, hypertension is seen in consultation for chronic abdominal pain and bloating, irregular bowel habits.  Her symptoms are secondary to chronic idiopathic constipation Discussed about high-fiber diet, information provided Discussed about fiber supplements, adequate intake of water, cut back on processed foods, fatty, greasy foods and carbs Bottle of magnesium citrate today to clean out Start MiraLAX 1-2 times daily Also, have discussed with her regarding various options for treatment of chronic constipation.  She would like to try MiraLAX before taking any prescription medication   Follow up in 3 months   Cephas Darby, MD

## 2020-12-23 DIAGNOSIS — M6740 Ganglion, unspecified site: Secondary | ICD-10-CM | POA: Insufficient documentation

## 2021-03-30 ENCOUNTER — Ambulatory Visit: Payer: Medicare Other | Admitting: Gastroenterology

## 2021-06-07 ENCOUNTER — Encounter: Payer: Self-pay | Admitting: Gastroenterology

## 2021-06-07 ENCOUNTER — Ambulatory Visit: Payer: Medicare Other | Admitting: Gastroenterology

## 2021-06-07 ENCOUNTER — Other Ambulatory Visit: Payer: Self-pay

## 2021-06-07 DIAGNOSIS — K581 Irritable bowel syndrome with constipation: Secondary | ICD-10-CM

## 2021-06-07 MED ORDER — RIFAXIMIN 550 MG PO TABS: 550.0000 mg | ORAL_TABLET | Freq: Three times a day (TID) | ORAL | 0 refills | Status: DC

## 2021-06-07 NOTE — Progress Notes (Signed)
Cephas Darby, MD [address]  [place]  [place], [place] [address]  Main: [phone]  Fax: [phone]    Gastroenterology Consultation  Referring Provider:     Danae Orleans, MD Primary Care Physician:  Danae Orleans, MD Primary Gastroenterologist:  Dr. Cephas Darby Reason for Consultation:     IBS constipation       HPI:   Paula Daniels is a 80 y.o. female referred by Dr. Danae Orleans, MD  for consultation & management of abdominal pain and bloating.  Patient is here for follow-up of approximately 2 months history of progressively worsening generalized abdominal discomfort and abdominal bloating associated with severe constipation.  Patient reports that she has constipation for several years, has alternating episodes of constipation and diarrhea.  Patient has been taking laxatives, which are no longer effective.  Patient went to [facility] ER on 10/30/2020 secondary to severe abdominal pain, underwent CT abdomen pelvis with IV contrast which revealed colonic diverticulosis only.  Labs were otherwise unremarkable. Patient is accompanied by her daughter today.  Patient lives alone, independent of ADLs, socializes regularly.  She does admit to not eating healthy.  Her diet is devoid of fiber.  She does not like to cook just for herself.  Follow-up visit 06/07/2021 Patient is here for follow-up of left lower quadrant abdominal pain associated with abdominal bloating and diarrhea.  Patient was previously seen by me for severe constipation.  We discussed about high-fiber diet, cut back on processed foods, sugary drinks, carbonated beverages and also daily MiraLAX.  Patient reports that she was feeling well for few months as she was exhibiting more greens, cut back on processed foods, sugary drinks, carbonated beverages.  She continues to take MiraLAX.  For last 2 days, she reports having bouts of nonbloody diarrhea associated with left lower abdominal discomfort and significant  abdominal bloating.  She had hamburger on Monday and cereal yesterday.  Both days she has had bouts of diarrhea.  She denies any fever, chills, nausea or vomiting.  She is still taking MiraLAX once daily.  Her most recent labs from 05/2021 were unremarkable including TSH, CBC, CMP  She does not smoke or drink alcohol  NSAIDs: None  Antiplts/Anticoagulants/Anti thrombotics: None  GI Procedures:  Col[facility] 01/18/2019 at [facility] Impression:        - Diverticulosis in the recto-sigmoid colon, in the                     sigmoid colon and in the descending colon.                     - Diverticulosis in the ascending colon.                     - Stool in the entire examined colon.                     - One 5 mm polyp at the recto-sigmoid colon, removed with                     a hot snare. Resected and retrieved.                     - Two 4 to 5 mm polyps in the descending colon, in the                     proximal  descending colon and in the distal descending                     colon, removed with a cold snare. Complete resection.                     Partial retrieval.                     - Internal hemorrhoids.                     - The examined portion of the ileum was normal.                     No source of pain identified.   A: Colon, left, biopsy - Adenomatous polyp (multiple fragments), no high-grade dysplasia identified  Past Medical History:  Diagnosis Date   Anxiety    Cancer ([place])    Hypertension    Migraine     Past Surgical History:  Procedure Laterality Date   CATARACT EXTRACTION Bilateral    KNEE SURGERY Right     Current Outpatient Medications:    rifaximin (XIFAXAN) 550 MG TABS tablet, Take 1 tablet (550 mg total) by mouth 3 (three) times daily for 14 days., Disp: 42 tablet, Rfl: 0   topiramate (TOPAMAX) 50 MG tablet, Take by mouth., Disp: , Rfl:    acetaminophen (TYLENOL) 500 MG tablet, Take 1 tablet by mouth daily., Disp: , Rfl:    ALPRAZolam (XANAX) 0.5 MG  tablet, Take 1 tablet (0.5 mg total) by mouth at bedtime as needed for anxiety or sleep., Disp: 10 tablet, Rfl: 0   amitriptyline (ELAVIL) 25 MG tablet, Take 25 mg by mouth at bedtime. , Disp: , Rfl:    atorvastatin (LIPITOR) 20 MG tablet, Take 20 mg by mouth daily. , Disp: , Rfl:    baclofen (LIORESAL) 10 MG tablet, Take 0.5-1 tablets (5-10 mg total) by mouth 3 (three) times daily as needed for muscle spasms (Neck pain)., Disp: 30 each, Rfl: 0   Cholecalciferol 1000 units tablet, Take 2,000 Units by mouth daily., Disp: , Rfl:    gabapentin (NEURONTIN) 300 MG capsule, Take by mouth., Disp: , Rfl:    hydrochlorothiazide (HYDRODIURIL) 25 MG tablet, Take 25 mg by mouth daily. , Disp: , Rfl:    omeprazole (PRILOSEC) 40 MG capsule, Take 40 mg by mouth daily., Disp: , Rfl:    ondansetron (ZOFRAN) 4 MG tablet, Take 4 mg by mouth every 8 (eight) hours as needed for nausea or vomiting., Disp: , Rfl:    potassium chloride SA (K-DUR,KLOR-CON) 20 MEQ tablet, Take 20 mEq by mouth daily., Disp: , Rfl:    propranolol (INDERAL) 20 MG tablet, Take 20 mg by mouth 2 (two) times daily., Disp: , Rfl:    SUMAtriptan (IMITREX) 50 MG tablet, Take 50 mg by mouth 2 (two) times daily as needed., Disp: , Rfl:    venlafaxine (EFFEXOR) 37.5 MG tablet, Take 37.5 mg by mouth daily., Disp: , Rfl:    Family History  Problem Relation Age of Onset   Colon cancer Mother    Heart attack Mother    Other Father        MVA     Social History   Tobacco Use   Smoking status: Never   Smokeless tobacco: Never  Vaping Use   Vaping Use: Never used  Substance Use Topics   Alcohol use: No  Drug use: No    Allergies as of 06/07/2021 - Review Complete 06/07/2021  Allergen Reaction Noted   Ciprofloxacin Itching and Rash 01/18/2014   Codeine Nausea Only and Nausea And Vomiting 12/03/2012   Metronidazole Rash and Itching 01/18/2014   Sumatriptan  07/17/2020    Review of Systems:    All systems reviewed and negative except  where noted in HPI.   Physical Exam:  There were no vitals taken for this visit. No LMP recorded. Patient has had a hysterectomy.  General:   Alert,  Well-developed, well-nourished, pleasant and cooperative in NAD Head:  Normocephalic and atraumatic. Eyes:  Sclera clear, no icterus.   Conjunctiva pink. Ears:  Normal auditory acuity. Nose:  No deformity, discharge, or lesions. Mouth:  No deformity or lesions,oropharynx pink & moist. Neck:  Supple; no masses or thyromegaly. Lungs:  Respirations even and unlabored.  Clear throughout to auscultation.   No wheezes, crackles, or rhonchi. No acute distress. Heart:  Regular rate and rhythm; no murmurs, clicks, rubs, or gallops. Abdomen:  Normal bowel sounds. Soft, non-tender and diffusely distended, tympanic to percussion without masses, hepatosplenomegaly or hernias noted.  No guarding or rebound tenderness.   Rectal: Not performed Msk:  Symmetrical without gross deformities. Good, equal movement & strength bilaterally. Pulses:  Normal pulses noted. Extremities:  No clubbing or edema.  No cyanosis. Neurologic:  Alert and oriented x3;  grossly normal neurologically. Skin:  Intact without significant lesions or rashes. No jaundice. Psych:  Alert and cooperative. Normal mood and affect.  Imaging Studies: Reviewed  Assessment and Plan:   SARYNA KNEELAND is a 80 y.o. pleasant Caucasian female with history of anxiety, migraine, hypertension, severe left-sided colonic diverticulosis is seen in consultation for chronic left-sided abdominal pain and bloating, irregular bowel habits.  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  Constipation predominant IBS Continue high-fiber diet Reiterated on fiber supplements, adequate intake of water, cut back on processed foods, fatty, greasy foods and carbs Cut back on MiraLAX to half a cap as patient is experiencing diarrhea She may also have lactose intolerance, will try lactobacillus  probiotics, samples given Since patient has significant abdominal bloating from underlying chronic constipation, will try Xifaxan 550 mg 3 times daily for 2 weeks Patient will call my office back in 2 weeks if she has ongoing diarrhea, will check GI profile PCR and pancreatic fecal elastase levels   Follow up in 4 months   Cephas Darby, MD

## 2021-06-07 NOTE — Patient Instructions (Signed)
Givin restora samples please let us know if these help and we can get you a prescription.

## 2021-06-11 ENCOUNTER — Telehealth: Payer: Self-pay | Admitting: Gastroenterology

## 2021-06-11 NOTE — Telephone Encounter (Signed)
Medication refill for rifaximin (xifaxan) 550 mgs

## 2021-06-12 ENCOUNTER — Other Ambulatory Visit: Payer: Self-pay

## 2021-06-12 MED ORDER — RIFAXIMIN 550 MG PO TABS: 550.0000 mg | ORAL_TABLET | Freq: Three times a day (TID) | ORAL | 0 refills | Status: AC

## 2021-06-12 NOTE — Progress Notes (Signed)
Sent in refill for xifaxin as requested

## 2021-06-21 ENCOUNTER — Telehealth: Payer: Self-pay

## 2021-06-21 ENCOUNTER — Telehealth: Payer: Self-pay | Admitting: Gastroenterology

## 2021-06-21 NOTE — Telephone Encounter (Signed)
Called patient to let her know you can buy restora over the counter

## 2021-06-21 NOTE — Telephone Encounter (Signed)
Patient wants restora med sent into pharmacy the samples help so sent to dr Marius Ditch for doseage

## 2021-06-29 NOTE — Telephone Encounter (Signed)
Pt stated that there were not any medication left over the counter. Pt wanted to know if there is way for it to be called in.

## 2021-07-02 ENCOUNTER — Telehealth: Payer: Self-pay | Admitting: Gastroenterology

## 2021-07-02 ENCOUNTER — Telehealth: Payer: Self-pay

## 2021-07-02 NOTE — Telephone Encounter (Signed)
  Patient returning your phone call. Clinincal staff will follow up with patient.

## 2021-07-02 NOTE — Telephone Encounter (Signed)
I called patient she wondered if we knew of a cheaper way to get the restora medicine I checked for coupons and but we didn't have any but she said ok she had found it on [facility] for 40.00 so she would just do that

## 2021-07-02 NOTE — Telephone Encounter (Signed)
CALLED PATIENT NO ANSWER LEFT VOICEMAIL FOR A CALL BACK ? ?

## 2021-07-07 ENCOUNTER — Encounter: Payer: Self-pay | Admitting: Emergency Medicine

## 2021-07-07 ENCOUNTER — Other Ambulatory Visit: Payer: Self-pay

## 2021-07-07 ENCOUNTER — Ambulatory Visit
Admission: EM | Admit: 2021-07-07 | Discharge: 2021-07-07 | Disposition: A | Discharge status: Home / Self Care | Payer: Medicare Other | Attending: Internal Medicine | Admitting: Internal Medicine

## 2021-07-07 DIAGNOSIS — M5432 Sciatica, left side: Secondary | ICD-10-CM | POA: Insufficient documentation

## 2021-07-07 LAB — URINALYSIS, COMPLETE (UACMP) WITH MICROSCOPIC
Bacteria, UA: NONE SEEN
Bilirubin Urine: NEGATIVE
Glucose, UA: NEGATIVE mg/dL
Hgb urine dipstick: NEGATIVE
Ketones, ur: NEGATIVE mg/dL
Leukocytes,Ua: NEGATIVE
Nitrite: NEGATIVE
Protein, ur: NEGATIVE mg/dL
Specific Gravity, Urine: 1.015 (ref 1.005–1.030)
pH: 7 (ref 5.0–8.0)

## 2021-07-07 MED ORDER — MELOXICAM 7.5 MG PO TABS: 7.5000 mg | ORAL_TABLET | Freq: Every day | ORAL | 0 refills | Status: DC

## 2021-07-07 MED ORDER — BACLOFEN 10 MG PO TABS: 10.0000 mg | ORAL_TABLET | Freq: Three times a day (TID) | ORAL | 0 refills | Status: AC

## 2021-07-07 NOTE — ED Triage Notes (Signed)
Patient c/o lower back and left hip pain that started on Thursday and got worse yesterday.  Patient denies injury and fall.

## 2021-07-07 NOTE — ED Provider Notes (Signed)
[facility]    CSN: [id number] Arrival date & time: 07/07/21  0941      History   Chief Complaint Chief Complaint  Patient presents with   Back Pain   Hip Pain    left    HPI Paula Daniels is a 80 y.o. female who presents with lo back pain and L hip pain since yesterday. She denies injuring herself. Has past hx of having L4-L5 laminectomy with fusion. She had been up on her feel all day 2 days ago cooking for Thanksgiving. Last night was up all night due to pain and had urinary frequency x 6 which is unusual for her. 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   Essential hypertension, benign 04/20/2018   Anxiety, generalized 04/14/2018   HTN, goal below 140/90 04/14/2018   Primary osteoarthritis of both knees 04/14/2018   Degenerative spondylolisthesis 12/17/2016   Spinal stenosis of lumbar 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 claudication 11/08/2016   Benign essential tremor 02/28/2015   Chronic migraine without aura without status migrainosus, not intractable 07/25/2014   Dyslipidemia, goal LDL below 130 03/15/2013   Gastro-esophageal reflux disease without esophagitis 12/03/2012   Contact dermatitis and other eczema due to other specified agent 04/20/2012   Primary localized osteoarthrosis, lower leg 03/06/2012   Derangement of medial meniscus 12/31/2011   Diverticulosis of large intestine without perforation or abscess without bleeding  05/03/2009    Past Surgical History:  Procedure Laterality Date   CATARACT EXTRACTION Bilateral    KNEE SURGERY Right     OB History   No obstetric history on file.      Home Medications    Prior to Admission medications   Medication Sig Start Date End Date Taking? Authorizing Provider  amitriptyline (ELAVIL) 25 MG tablet Take 25 mg by mouth at bedtime.    Yes [provider]  atorvastatin (LIPITOR) 20 MG tablet Take 20 mg by mouth daily.    Yes [provider]  baclofen (LIORESAL) 10 MG tablet Take 1 tablet (10 mg total) by mouth 3 (three) times daily. 07/07/21  Yes Rodriguez-Southworth, Sandrea Matte  Cholecalciferol 1000 units tablet Take 2,000 Units by mouth daily.   Yes [provider]  gabapentin (NEURONTIN) 300 MG capsule TAKE 1 CAPSULE DAILY AS NEEDED FOR BACK PAIN 06/08/21  Yes [provider]  hydrochlorothiazide (HYDRODIURIL) 25 MG tablet Take 25 mg by mouth daily.    Yes [provider]  meloxicam (MOBIC) 7.5 MG tablet Take 1 tablet (7.5 mg total) by mouth daily. For pain and inflammation 07/07/21  Yes Rodriguez-Southworth, Sunday Spillers, PA-C  omeprazole (PRILOSEC) 40 MG capsule Take 40 mg by mouth daily.   Yes [provider]  potassium chloride SA (K-DUR,KLOR-CON) 20 MEQ tablet Take 20 mEq by mouth daily.   Yes [provider]  propranolol (INDERAL) 20 MG tablet Take 20 mg by mouth 2 (two) times daily.   Yes [provider]  topiramate (TOPAMAX) 50 MG tablet Take by mouth. 01/08/21 01/08/22 Yes [provider]  venlafaxine (EFFEXOR) 37.5 MG tablet Take 37.5 mg by mouth daily.   Yes [provider]  acetaminophen (TYLENOL) 500 MG tablet Take 1 tablet by mouth daily.    [provider]  ALPRAZolam Duanne Moron) 0.5 MG tablet Take 1 tablet (0.5 mg total) by mouth at bedtime as needed for anxiety or sleep. 11/05/19   Coral Spikes, DO  ondansetron (ZOFRAN) 4 MG tablet Take 4 mg by mouth every  8 (eight) hours as needed for nausea or vomiting.    [provider]    Family History Family History  Problem Relation Age of Onset   Colon cancer Mother    Heart attack Mother    Other Father        MVA    Social History Social History   Tobacco Use   Smoking status: Never   Smokeless tobacco: Never  Vaping Use   Vaping Use: Never used  Substance Use Topics   Alcohol use: No   Drug use: No     Allergies   Ciprofloxacin, Codeine, Metronidazole, and Sumatriptan   Review of Systems Review of Systems  Constitutional:  Negative for diaphoresis and fever.  Gastrointestinal:  Negative for abdominal pain.  Genitourinary:  Positive for flank pain. Negative for dysuria and urgency.  Musculoskeletal:  Positive for back pain.  Skin:  Negative for rash.  Neurological:  Negative for weakness and numbness.    Physical Exam Triage Vital Signs ED Triage Vitals  Enc Vitals Group     BP 07/07/21 1012 (!) 158/75     Pulse Rate 07/07/21 1012 (!) 56     Resp 07/07/21 1012 14     Temp 07/07/21 1012 98.3 F (36.8 C)     Temp Source 07/07/21 1012 Oral     SpO2 07/07/21 1012 96 %     Weight 07/07/21 1010 162 lb (73.5 kg)     Height 07/07/21 1010 5\' 5"  (1.651 m)     Head Circumference --      Peak Flow --      Pain Score 07/07/21 1009 8     Pain Loc --      Pain Edu? --      Excl. in [place]? --    No data found.  Updated Vital Signs BP (!) 158/75 (BP Location: Left Arm)   Pulse (!) 56   Temp 98.3 F (36.8 C) (Oral)   Resp 14   Ht 5\' 5"  (1.651 m)   Wt 162 lb (73.5 kg)   SpO2 96%   BMI 26.96 kg/m   Visual Acuity Right Eye Distance:   Left Eye Distance:   Bilateral Distance:    Right Eye Near:   Left Eye Near:    Bilateral Near:     Physical Exam Vitals and nursing note reviewed.  Constitutional:      General: She is not in acute distress.    Appearance: She is normal weight. She is not toxic-appearing.  HENT:     Right Ear: External ear normal.      Left Ear: External ear normal.  Eyes:     General: No scleral icterus.    Conjunctiva/sclera: Conjunctivae normal.  Pulmonary:     Effort: Pulmonary effort is normal.  Abdominal:     Tenderness: There is no left CVA tenderness.  Musculoskeletal:     Cervical back: Neck supple.  Comments: BACK- has local tenderness on L mid lumbar region with palpation and provoked with lateral flexion, anterior flexion to 60 degrees and raising back up from anterior flexion, as well as extreme thoracic rotation. Has neg SLR.   Skin:    General: Skin is warm and dry.     Findings: No rash.  Neurological:     Mental Status: She is alert and oriented to person, place, and time.     Motor: No weakness.     Comments: Has hyporeflexia of lower extremities   Psychiatric:        Mood and Affect: Mood normal.        Behavior: Behavior normal.        Thought Content: Thought content normal.        Judgment: Judgment normal.     UC Treatments / Results  Labs (all labs ordered are listed, but only abnormal results are displayed) Labs Reviewed  URINALYSIS, COMPLETE (UACMP) WITH MICROSCOPIC    EKG   Radiology No results found.  Procedures Procedures (including critical care time)  Medications Ordered in UC Medications - No data to display  Initial Impression / Assessment and Plan / UC Course  I have reviewed the triage vital signs and the nursing notes. L lumbar strain I placed her on Mobic and Baclofen as noted. See instructions.  Final Clinical Impressions(s) / UC Diagnoses   Final diagnoses:  Left sided sciatica     Discharge Instructions      Alternate ice and heat for 15 minutes each 3-4 times a day for 3-5 days Follow up with your primary care doctor next week.      ED Prescriptions     Medication Sig Dispense Auth. Provider   baclofen (LIORESAL) 10 MG tablet Take 1 tablet (10 mg total) by mouth 3 (three) times daily. 30 each Rodriguez-Southworth, Sunday Spillers, PA-C    meloxicam (MOBIC) 7.5 MG tablet Take 1 tablet (7.5 mg total) by mouth daily. For pain and inflammation 10 tablet Rodriguez-Southworth, Sunday Spillers, PA-C      PDMP not reviewed this encounter.   Shelby Mattocks, PA-C 07/07/21 1551

## 2021-07-07 NOTE — Discharge Instructions (Addendum)
Alternate ice and heat for 15 minutes each 3-4 times a day for 3-5 days Follow up with your primary care doctor next week.

## 2021-10-04 ENCOUNTER — Encounter: Payer: Self-pay | Admitting: Gastroenterology

## 2021-10-04 ENCOUNTER — Ambulatory Visit: Payer: Medicare Other | Admitting: Gastroenterology

## 2021-10-04 ENCOUNTER — Other Ambulatory Visit: Payer: Self-pay

## 2021-10-04 VITALS — BP 137/73 | HR 59 | Temp 97.6°F | Ht 65.0 in | Wt 156.1 lb

## 2021-10-04 DIAGNOSIS — R140 Abdominal distension (gaseous): Secondary | ICD-10-CM

## 2021-10-04 DIAGNOSIS — K529 Noninfective gastroenteritis and colitis, unspecified: Secondary | ICD-10-CM | POA: Diagnosis not present

## 2021-10-04 NOTE — Progress Notes (Signed)
Cephas Darby, MD [address]  [employer]  [place], [place] [address]  Main: [phone]  Fax: [phone]    Gastroenterology Consultation  Referring Provider:     Danae Orleans, MD Primary Care Physician:  Danae Orleans, MD Primary Gastroenterologist:  Dr. Cephas Darby Reason for Consultation:     IBS constipation       HPI:   Paula Daniels is a 81 y.o. female referred by Dr. Danae Orleans, MD  for consultation & management of abdominal pain and bloating.  Patient is here for follow-up of approximately 2 months history of progressively worsening generalized abdominal discomfort and abdominal bloating associated with severe constipation.  Patient reports that she has constipation for several years, has alternating episodes of constipation and diarrhea.  Patient has been taking laxatives, which are no longer effective.  Patient went to [facility] ER on 10/30/2020 secondary to severe abdominal pain, underwent CT abdomen pelvis with IV contrast which revealed colonic diverticulosis only.  Labs were otherwise unremarkable. Patient is accompanied by her daughter today.  Patient lives alone, independent of ADLs, socializes regularly.  She does admit to not eating healthy.  Her diet is devoid of fiber.  She does not like to cook just for herself.  Follow-up visit 06/07/2021 Patient is here for follow-up of left lower quadrant abdominal pain associated with abdominal bloating and diarrhea.  Patient was previously seen by me for severe constipation.  We discussed about high-fiber diet, cut back on processed foods, sugary drinks, carbonated beverages and also daily MiraLAX.  Patient reports that she was feeling well for few months as she was exhibiting more greens, cut back on processed foods, sugary drinks, carbonated beverages.  She continues to take MiraLAX.  For last 2 days, she reports having bouts of nonbloody diarrhea associated with left lower abdominal discomfort and significant  abdominal bloating.  She had hamburger on Monday and cereal yesterday.  Both days she has had bouts of diarrhea.  She denies any fever, chills, nausea or vomiting.  She is still taking MiraLAX once daily.  Her most recent labs from 05/2021 were unremarkable including TSH, CBC, CMP  Follow-up visit 10/04/2021 Paula Daniels is here for follow-up of ongoing diarrhea.  Patient reports that she has been experiencing nonbloody diarrhea, 3-4 times daily, right after she eats, she develops cramping in her right upper quadrant followed by diarrhea.  She does report abdominal bloating.  She denies rectal bleeding.  She is taking Pepto-Bismol as needed.  She stopped MiraLAX completely.  She drinks 1 Diet Coke daily.  She has been eating cheese daily hoping that it would help improve her diarrhea.  She lost about 10 pounds within the last 6 months.   She does not smoke or drink alcohol  NSAIDs: None  Antiplts/Anticoagulants/Anti thrombotics: None  GI Procedures:  Colonoscopy 01/18/2019 at [facility] Impression:        - Diverticulosis in the recto-sigmoid colon, in the                     sigmoid colon and in the descending colon.                     - Diverticulosis in the ascending colon.                     - Stool in the entire examined colon.                     -  One 5 mm polyp at the recto-sigmoid colon, removed with                     a hot snare. Resected and retrieved.                     - Two 4 to 5 mm polyps in the descending colon, in the                     proximal descending colon and in the distal descending                     colon, removed with a cold snare. Complete resection.                     Partial retrieval.                     - Internal hemorrhoids.                     - The examined portion of the ileum was normal.                     No source of pain identified.   A: Colon, left, biopsy - Adenomatous polyp (multiple fragments), no high-grade dysplasia identified  Past  Medical History:  Diagnosis Date   Anxiety    Cancer ([place])    Hypertension    Migraine     Past Surgical History:  Procedure Laterality Date   CATARACT EXTRACTION Bilateral    KNEE SURGERY Right     Current Outpatient Medications:    acetaminophen (TYLENOL) 500 MG tablet, Take 1 tablet by mouth daily., Disp: , Rfl:    ALPRAZolam (XANAX) 0.5 MG tablet, Take 1 tablet (0.5 mg total) by mouth at bedtime as needed for anxiety or sleep., Disp: 10 tablet, Rfl: 0   amitriptyline (ELAVIL) 25 MG tablet, Take 25 mg by mouth at bedtime. , Disp: , Rfl:    atorvastatin (LIPITOR) 20 MG tablet, Take 20 mg by mouth daily. , Disp: , Rfl:    baclofen (LIORESAL) 10 MG tablet, Take 1 tablet (10 mg total) by mouth 3 (three) times daily., Disp: 30 each, Rfl: 0   Cholecalciferol 1000 units tablet, Take 2,000 Units by mouth daily., Disp: , Rfl:    gabapentin (NEURONTIN) 300 MG capsule, TAKE 1 CAPSULE DAILY AS NEEDED FOR BACK PAIN, Disp: , Rfl:    hydrochlorothiazide (HYDRODIURIL) 25 MG tablet, Take 25 mg by mouth daily. , Disp: , Rfl:    omeprazole (PRILOSEC) 40 MG capsule, Take 40 mg by mouth daily., Disp: , Rfl:    ondansetron (ZOFRAN) 4 MG tablet, Take 4 mg by mouth every 8 (eight) hours as needed for nausea or vomiting., Disp: , Rfl:    potassium chloride SA (K-DUR,KLOR-CON) 20 MEQ tablet, Take 20 mEq by mouth daily., Disp: , Rfl:    propranolol (INDERAL) 20 MG tablet, Take 20 mg by mouth 2 (two) times daily., Disp: , Rfl:    topiramate (TOPAMAX) 50 MG tablet, Take by mouth., Disp: , Rfl:    venlafaxine (EFFEXOR) 37.5 MG tablet, Take 37.5 mg by mouth daily., Disp: , Rfl:    Family History  Problem Relation Age of Onset   Colon cancer Mother    Heart attack Mother    Other Father        MVA  Social History   Tobacco Use   Smoking status: Never   Smokeless tobacco: Never  Vaping Use   Vaping Use: Never used  Substance Use Topics   Alcohol use: No   Drug use: No    Allergies as of  10/04/2021 - Review Complete 10/04/2021  Allergen Reaction Noted   Ciprofloxacin Itching and Rash 01/18/2014   Codeine Nausea Only and Nausea And Vomiting 12/03/2012   Metronidazole Rash and Itching 01/18/2014   Sumatriptan  07/17/2020    Review of Systems:    All systems reviewed and negative except where noted in HPI.   Physical Exam:  BP 137/73 (BP Location: Left Arm, Patient Position: Sitting, Cuff Size: Normal)    Pulse (!) 59    Temp 97.6 F (36.4 C) (Oral)    Ht 5\' 5"  (1.651 m)    Wt 156 lb 2 oz (70.8 kg)    BMI 25.98 kg/m  No LMP recorded. Patient has had a hysterectomy.  General:   Alert,  Well-developed, well-nourished, pleasant and cooperative in NAD Head:  Normocephalic and atraumatic. Eyes:  Sclera clear, no icterus.   Conjunctiva pink. Ears:  Normal auditory acuity. Nose:  No deformity, discharge, or lesions. Mouth:  No deformity or lesions,oropharynx pink & moist. Neck:  Supple; no masses or thyromegaly. Lungs:  Respirations even and unlabored.  Clear throughout to auscultation.   No wheezes, crackles, or rhonchi. No acute distress. Heart:  Regular rate and rhythm; no murmurs, clicks, rubs, or gallops. Abdomen:  Normal bowel sounds. Soft, non-tender and diffusely distended, tympanic to percussion without masses, hepatosplenomegaly or hernias noted.  No guarding or rebound tenderness.   Rectal: Not performed Msk:  Symmetrical without gross deformities. Good, equal movement & strength bilaterally. Pulses:  Normal pulses noted. Extremities:  No clubbing or edema.  No cyanosis. Neurologic:  Alert and oriented x3;  grossly normal neurologically. Skin:  Intact without significant lesions or rashes. No jaundice. Psych:  Alert and cooperative. Normal mood and affect.  Imaging Studies: Reviewed  Assessment and Plan:   Paula Daniels is a 81 y.o. pleasant Caucasian female with history of anxiety, migraine, hypertension, severe left-sided colonic diverticulosis is seen  in consultation for chronic nonbloody diarrhea, worse postprandial associated with right-sided abdominal pain, abdominal bloating, unintentional weight loss  Check H. pylori stool antigen, GI profile PCR, celiac disease panel, calprotectin levels and pancreatic fecal elastase levels, CRP I have also discussed with patient regarding colonoscopy with TI evaluation and biopsies to evaluate for microscopic colitis, patient would like to undergo above work-up before proceeding with colonoscopy Discussed about 2 weeks trial of strict lactose-free diet and patient agreed Okay to take Pepto-Bismol as needed  Follow up in 3 months   Cephas Darby, MD

## 2021-10-29 ENCOUNTER — Ambulatory Visit
Admission: EM | Admit: 2021-10-29 | Discharge: 2021-10-29 | Disposition: A | Discharge status: Home / Self Care | Payer: Medicare Other | Attending: Emergency Medicine | Admitting: Emergency Medicine

## 2021-10-29 ENCOUNTER — Other Ambulatory Visit: Payer: Self-pay

## 2021-10-29 DIAGNOSIS — R197 Diarrhea, unspecified: Secondary | ICD-10-CM | POA: Diagnosis not present

## 2021-10-29 DIAGNOSIS — R0602 Shortness of breath: Secondary | ICD-10-CM | POA: Diagnosis not present

## 2021-10-29 DIAGNOSIS — J029 Acute pharyngitis, unspecified: Secondary | ICD-10-CM | POA: Insufficient documentation

## 2021-10-29 DIAGNOSIS — Z20822 Contact with and (suspected) exposure to covid-19: Secondary | ICD-10-CM | POA: Diagnosis not present

## 2021-10-29 DIAGNOSIS — R0981 Nasal congestion: Secondary | ICD-10-CM | POA: Diagnosis present

## 2021-10-29 DIAGNOSIS — B349 Viral infection, unspecified: Secondary | ICD-10-CM

## 2021-10-29 DIAGNOSIS — R42 Dizziness and giddiness: Secondary | ICD-10-CM | POA: Diagnosis not present

## 2021-10-29 LAB — BASIC METABOLIC PANEL
Anion gap: 9 (ref 5–15)
BUN: 13 mg/dL (ref 8–23)
CO2: 29 mmol/L (ref 22–32)
Calcium: 9.2 mg/dL (ref 8.9–10.3)
Chloride: 97 mmol/L — ABNORMAL LOW (ref 98–111)
Creatinine, Ser: 0.79 mg/dL (ref 0.44–1.00)
GFR, Estimated: >60 mL/min (ref >60)
Glucose, Bld: 100 mg/dL — ABNORMAL HIGH (ref 70–99)
Potassium: 3 mmol/L — ABNORMAL LOW (ref 3.5–5.1)
Sodium: 135 mmol/L (ref 135–145)

## 2021-10-29 LAB — RESP PANEL BY RT-PCR (FLU A&B, COVID) ARPGX2
Influenza A by PCR: NEGATIVE
Influenza B by PCR: NEGATIVE
SARS Coronavirus 2 by RT PCR: NEGATIVE

## 2021-10-29 MED ORDER — IPRATROPIUM BROMIDE 0.06 % NA SOLN: 2.0000 | Freq: Four times a day (QID) | NASAL | 12 refills | Status: DC

## 2021-10-29 MED ORDER — SODIUM CHLORIDE 0.9 % IV BOLUS
1000.0000 mL | Freq: Once | INTRAVENOUS | Status: AC
  Administered 2021-10-29: 1000 mL via INTRAVENOUS

## 2021-10-29 MED ORDER — BENZONATATE 100 MG PO CAPS: 200.0000 mg | ORAL_CAPSULE | Freq: Three times a day (TID) | ORAL | 0 refills | Status: DC

## 2021-10-29 NOTE — Discharge Instructions (Addendum)
Use the Atrovent nasal spray, 2 squirts in each nostril every 6 hours, as needed for runny nose and postnasal drip. ? ?Use the Tessalon Perles every 8 hours during the day.  Take them with a small sip of water.  They may give you some numbness to the base of your tongue or a metallic taste in your mouth, this is normal. ? ?You can use over-the-counter Delsym, Robitussin, Zarbee's as needed for cough at bedtime. ? ?You need to resume taking your potassium as your potassium is mildly low on your blood work. ? ?Return for reevaluation or see your primary care provider for any new or worsening symptoms.  ?

## 2021-10-29 NOTE — ED Triage Notes (Signed)
Pt reports chronic hx of diarrhea. Was seen by GI back in feb. Diarrhea had resolved but started back up Weds. Provider instructed her not to take any medications for diarrhea.  ? ?Denies blood in stool.  ?

## 2021-10-29 NOTE — ED Provider Notes (Signed)
?[place] ? ? ? ?CSN: [id number] ?Arrival date & time: 10/29/21  1035 ? ? ?  ? ?History   ?Chief Complaint ?Chief Complaint  ?Patient presents with  ? Sore Throat  ? Nasal Congestion  ? ? ?HPI ?Paula Daniels is a 81 y.o. female.  ? ?HPI ? ?81 year old female here for evaluation of respiratory symptoms. ? ?Patient reports that her symptoms began 5 days ago and consist of sore throat with laryngitis and swollen lymph nodes in her neck, a cough that increases when she lays down and is nonproductive, some popping in her ears, mild shortness of breath and wheezing.  She also indicates that she had some nasal congestion without any significant nasal discharge.  She had no fever, nausea, vomiting, or known sick contacts.  She does have a history of chronic diarrhea which had pretty much resolved but returned 5 days ago.  She states that whenever she eats within a few minutes she has a large volume diarrheal stool.  She is concerned that she is dehydrated because she is also been experiencing some dizziness. ? ?Past Medical History:  ?Diagnosis Date  ? Anxiety   ? Cancer [facility])   ? Hypertension   ? Migraine   ? ? ?Patient Active Problem List  ? Diagnosis Date Noted  ? Ganglion cyst 12/23/2020  ? Insomnia 11/27/2020  ? Medication management 11/27/2020  ? DDD (degenerative disc disease), cervical 03/17/2020  ? Dyslipidemia 03/17/2020  ? History of bladder cancer 04/03/2019  ? Bladder mass 01/21/2019  ? Primary osteoarthritis of left knee 04/20/2018  ? 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 Essential hypertension, benign 04/20/2018  ? Anxiety, generalized 04/14/2018  ? HTN, goal below 140/90 04/14/2018  ? Primary osteoarthritis of both knees 04/14/2018  ? Degenerative spondylolisthesis 12/17/2016  ? Spinal stenosis of lumbar 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 claudication 11/08/2016  ? Benign essential tremor 02/28/2015  ? Chronic migraine without aura without status migrainosus, not intractable 07/25/2014  ?  Dyslipidemia, goal LDL below 130 03/15/2013  ? Gastro-esophageal reflux disease without esophagitis 12/03/2012  ? Contact dermatitis and other eczema due to other specified agent 04/20/2012  ? Primary localized osteoarthrosis, lower leg 03/06/2012  ? Derangement of medial meniscus 12/31/2011  ? Diverticulosis of large intestine without perforation or abscess without bleeding 05/03/2009  ? ? ?Past Surgical History:  ?Procedure Laterality Date  ? CATARACT EXTRACTION Bilateral   ? KNEE SURGERY Right   ? ? ?OB History   ?No obstetric history on file. ?  ? ? ? ?Home Medications   ? ?Prior to Admission medications   ?Medication Sig Start Date End Date Taking? Authorizing Provider  ?benzonatate (TESSALON) 100 MG capsule Take 2 capsules (200 mg total) by mouth every 8 (eight) hours. 10/29/21  Yes Margarette Canada, NP  ?ipratropium (ATROVENT) 0.06 % nasal spray Place 2 sprays into both nostrils 4 (four) times daily. 10/29/21  Yes Margarette Canada, NP  ?acetaminophen (TYLENOL) 500 MG tablet Take 1 tablet by mouth daily.    [provider]  ?ALPRAZolam Duanne Moron) 0.5 MG tablet Take 1 tablet (0.5 mg total) by mouth at bedtime as needed for anxiety or sleep. 11/05/19   Coral Spikes, DO  ?amitriptyline (ELAVIL) 25 MG tablet Take 25 mg by mouth at bedtime.     [provider]  ?atorvastatin (LIPITOR) 20 MG tablet Take 20 mg by mouth daily.     [provider]  ?baclofen (LIORESAL) 10 MG tablet Take 1 tablet (10 mg total)  by mouth 3 (three) times daily. 07/07/21   Rodriguez-Southworth, Sunday Spillers, PA-C  ?Cholecalciferol 1000 units tablet Take 2,000 Units by mouth daily.    [provider]  ?gabapentin (NEURONTIN) 300 MG capsule TAKE 1 CAPSULE DAILY AS NEEDED FOR BACK PAIN 06/08/21   [provider]  ?hydrochlorothiazide (HYDRODIURIL) 25 MG tablet Take 25 mg by mouth daily.     [provider]  ?omeprazole (PRILOSEC) 40 MG capsule Take 40 mg by mouth daily.    [provider]   ?ondansetron (ZOFRAN) 4 MG tablet Take 4 mg by mouth every 8 (eight) hours as needed for nausea or vomiting.    [provider]  ?potassium chloride SA (K-DUR,KLOR-CON) 20 MEQ tablet Take 20 mEq by mouth daily.    [provider]  ?propranolol (INDERAL) 20 MG tablet Take 20 mg by mouth 2 (two) times daily.    [provider]  ?topiramate (TOPAMAX) 50 MG tablet Take by mouth. 01/08/21 01/08/22  [provider]  ?venlafaxine (EFFEXOR) 37.5 MG tablet Take 37.5 mg by mouth daily.    [provider]  ? ? ?Family History ?Family History  ?Problem Relation Age of Onset  ? Colon cancer Mother   ? Heart attack Mother   ? Other Father   ?     MVA  ? ? ?Social History ?Social History  ? ?Tobacco Use  ? Smoking status: Never  ? Smokeless tobacco: Never  ?Vaping Use  ? Vaping Use: Never used  ?Substance Use Topics  ? Alcohol use: No  ? Drug use: No  ? ? ? ?Allergies   ?Ciprofloxacin, Codeine, Metronidazole, and Sumatriptan ? ? ?Review of Systems ?Review of Systems  ?Constitutional:  Negative for fever.  ?HENT:  Positive for congestion, ear pain, sinus pressure, sore throat and voice change. Negative for rhinorrhea.   ?Respiratory:  Positive for cough, shortness of breath and wheezing.   ?Gastrointestinal:  Positive for diarrhea. Negative for abdominal pain, blood in stool, nausea and vomiting.  ?Skin:  Negative for rash.  ?Neurological:  Positive for dizziness.  ?Hematological: Negative.   ?Psychiatric/Behavioral: Negative.    ? ? ?Physical Exam ?Triage Vital Signs ?ED Triage Vitals  ?Enc Vitals Group  ?   BP 10/29/21 1153 138/61  ?   Pulse Rate 10/29/21 1153 (!) 51  ?   Resp 10/29/21 1153 18  ?   Temp 10/29/21 1153 98.4 ?F (36.9 ?C)  ?   Temp Source 10/29/21 1153 Oral  ?   SpO2 10/29/21 1153 100 %  ?   Weight --   ?   Height --   ?   Head Circumference --   ?   Peak Flow --   ?   Pain Score 10/29/21 1152 8  ?   Pain Loc --   ?   Pain Edu? --   ?   Excl. in [place]? --   ? ?No data  found. ? ?Updated Vital Signs ?BP 138/61 (BP Location: Left Arm)   Pulse (!) 51   Temp 98.4 ?F (36.9 ?C) (Oral)   Resp 18   SpO2 100%  ? ?Visual Acuity ?Right Eye Distance:   ?Left Eye Distance:   ?Bilateral Distance:   ? ?Right Eye Near:   ?Left Eye Near:    ?Bilateral Near:    ? ?Physical Exam ?Vitals and nursing note reviewed.  ?Constitutional:   ?   Appearance: Normal appearance. She is not ill-appearing.  ?HENT:  ?   Head: Normocephalic  and atraumatic.  ?   Right Ear: Tympanic membrane, ear canal and external ear normal. There is no impacted cerumen.  ?   Left Ear: Tympanic membrane, ear canal and external ear normal. There is no impacted cerumen.  ?   Nose: Congestion present. No rhinorrhea.  ?   Mouth/Throat:  ?   Mouth: Mucous membranes are dry.  ?   Pharynx: Posterior oropharyngeal erythema present.  ?Cardiovascular:  ?   Rate and Rhythm: Normal rate and regular rhythm.  ?   Pulses: Normal pulses.  ?   Heart sounds: Normal heart sounds. No murmur heard. ?  No friction rub. No gallop.  ?Pulmonary:  ?   Effort: Pulmonary effort is normal.  ?   Breath sounds: Normal breath sounds. No wheezing, rhonchi or rales.  ?Musculoskeletal:  ?   Cervical back: Normal range of motion and neck supple.  ?Lymphadenopathy:  ?   Cervical: Cervical adenopathy present.  ?Skin: ?   General: Skin is warm and dry.  ?   Capillary Refill: Capillary refill takes less than 2 seconds.  ?   Findings: No erythema or rash.  ?   Comments: Decreased skin turgor  ?Neurological:  ?   General: No focal deficit present.  ?   Mental Status: She is alert and oriented to person, place, and time.  ?Psychiatric:     ?   Mood and Affect: Mood normal.     ?   Behavior: Behavior normal.     ?   Thought Content: Thought content normal.     ?   Judgment: Judgment normal.  ? ? ? ?UC Treatments / Results  ?Labs ?(all labs ordered are listed, but only abnormal results are displayed) ?Labs Reviewed  ?BASIC METABOLIC PANEL - Abnormal; Notable for the  following components:  ?    Result Value  ? Potassium 3.0 (*)   ? Chloride 97 (*)   ? Glucose, Bld 100 (*)   ? All other components within normal limits  ?RESP PANEL BY RT-PCR (FLU A&B, COVID) ARPGX2  ? ? ?EKG ? ? ?Radiology ?No r

## 2021-10-29 NOTE — ED Triage Notes (Signed)
Patient presents to Urgent Care with complaints of sore throat, facial pressure, diarrhea, and congestion since weds. Not taking any meds for symptoms.  ? ?Denies fever.  ?

## 2021-11-20 DIAGNOSIS — C679 Malignant neoplasm of bladder, unspecified: Secondary | ICD-10-CM | POA: Insufficient documentation

## 2021-12-18 DIAGNOSIS — K529 Noninfective gastroenteritis and colitis, unspecified: Secondary | ICD-10-CM | POA: Insufficient documentation

## 2022-04-01 ENCOUNTER — Other Ambulatory Visit: Payer: Self-pay

## 2022-04-01 ENCOUNTER — Ambulatory Visit
Admission: RE | Admit: 2022-04-01 | Discharge: 2022-04-01 | Disposition: A | Discharge status: Home / Self Care | Payer: Medicare Other | Source: Ambulatory Visit | Attending: Emergency Medicine | Admitting: Emergency Medicine

## 2022-04-01 VITALS — BP 137/57 | HR 53 | Temp 97.7°F | Resp 20

## 2022-04-01 DIAGNOSIS — H6122 Impacted cerumen, left ear: Secondary | ICD-10-CM

## 2022-04-01 DIAGNOSIS — J069 Acute upper respiratory infection, unspecified: Secondary | ICD-10-CM | POA: Diagnosis not present

## 2022-04-01 LAB — SARS CORONAVIRUS 2 BY RT PCR: SARS Coronavirus 2 by RT PCR: NEGATIVE

## 2022-04-01 MED ORDER — BENZONATATE 100 MG PO CAPS: 200.0000 mg | ORAL_CAPSULE | Freq: Three times a day (TID) | ORAL | 0 refills | Status: DC

## 2022-04-01 MED ORDER — IPRATROPIUM BROMIDE 0.06 % NA SOLN: 2.0000 | Freq: Four times a day (QID) | NASAL | 12 refills | Status: DC

## 2022-04-01 NOTE — ED Provider Notes (Signed)
[facility]    CSN: [id number] Arrival date & time: 04/01/22  1435      History   Chief Complaint Chief Complaint  Patient presents with   Appointment    3:00   Ear Fullness    HPI Paula Daniels is a 81 y.o. female.   HPI  81 year old female here for evaluation of upper respiratory symptoms.  Patient reports that she has been experiencing fullness in her left ear and muffled hearing for the past 3 days.  This is also been associated with a generalized headache, runny nose, nasal congestion, and a nonproductive cough.  Patient states that sometimes she hears wheezing when she breathes but she denies any shortness of breath.  She has not had a fever, ringing in her left ear, drainage from her left ear, or sore throat.  She does endorse some mild pain in her right ear as well.  Past Medical History:  Diagnosis Date   Anxiety    Cancer ([place])    Hypertension    Migraine     Patient Active Problem List   Diagnosis Date Noted   Ganglion cyst 12/23/2020   Insomnia 11/27/2020   Medication management 11/27/2020   DDD (degenerative disc disease), cervical 03/17/2020   Dyslipidemia 03/17/2020   History of bladder cancer 04/03/2019   Bladder mass 01/21/2019   Primary osteoarthritis of left knee 04/20/2018   Status post left knee replacement 04/20/2018   Essential hypertension, benign 04/20/2018   Anxiety, generalized 04/14/2018   HTN, goal below 140/90 04/14/2018   Primary osteoarthritis of both knees 04/14/2018   Degenerative spondylolisthesis 12/17/2016   Spinal stenosis of lumbar 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 claudication 11/08/2016   Benign essential tremor 02/28/2015   Chronic migraine without aura without status migrainosus, not intractable 07/25/2014   Dyslipidemia, goal LDL below 130 03/15/2013   Gastro-esophageal reflux disease without esophagitis 12/03/2012   Contact dermatitis and other eczema due to other specified agent 04/20/2012   Primary  localized osteoarthrosis, lower leg 03/06/2012   Derangement of medial meniscus 12/31/2011   Diverticulosis of large intestine without perforation or abscess without bleeding 05/03/2009    Past Surgical History:  Procedure Laterality Date   CATARACT EXTRACTION Bilateral    KNEE SURGERY Right     OB History   No obstetric history on file.      Home Medications    Prior to Admission medications   Medication Sig Start Date End Date Taking? Authorizing Provider  benzonatate (TESSALON) 100 MG capsule Take 2 capsules (200 mg total) by mouth every 8 (eight) hours. 04/01/22  Yes Margarette Canada, NP  ipratropium (ATROVENT) 0.06 % nasal spray Place 2 sprays into both nostrils 4 (four) times daily. 04/01/22  Yes Margarette Canada, NP  acetaminophen (TYLENOL) 500 MG tablet Take 1 tablet by mouth daily.    [provider]  ALPRAZolam Duanne Moron) 0.5 MG tablet Take 1 tablet (0.5 mg total) by mouth at bedtime as needed for anxiety or sleep. 11/05/19   Coral Spikes, DO  amitriptyline (ELAVIL) 25 MG tablet Take 25 mg by mouth at bedtime.     [provider]  atorvastatin (LIPITOR) 20 MG tablet Take 20 mg by mouth daily.     [provider]  baclofen (LIORESAL) 10 MG tablet Take 1 tablet (10 mg total) by mouth 3 (three) times daily. Patient not taking: Reported on 04/01/2022 07/07/21   Rodriguez-Southworth, Sunday Spillers, PA-C  Cholecalciferol 1000 units tablet Take 2,000 Units by mouth daily.  [provider]  gabapentin (NEURONTIN) 300 MG capsule TAKE 1 CAPSULE DAILY AS NEEDED FOR BACK PAIN 06/08/21   [provider]  hydrochlorothiazide (HYDRODIURIL) 25 MG tablet Take 25 mg by mouth daily.  Patient not taking: Reported on 04/01/2022    [provider]  omeprazole (PRILOSEC) 40 MG capsule Take 40 mg by mouth daily.    [provider]  ondansetron (ZOFRAN) 4 MG tablet Take 4 mg by mouth every 8 (eight) hours as needed for nausea or vomiting. Patient not  taking: Reported on 04/01/2022    [provider]  potassium chloride SA (K-DUR,KLOR-CON) 20 MEQ tablet Take 20 mEq by mouth daily.    [provider]  propranolol (INDERAL) 20 MG tablet Take 20 mg by mouth 2 (two) times daily.    [provider]  venlafaxine (EFFEXOR) 37.5 MG tablet Take 37.5 mg by mouth daily.    [provider]    Family History Family History  Problem Relation Age of Onset   Colon cancer Mother    Heart attack Mother    Other Father        MVA    Social History Social History   Tobacco Use   Smoking status: Never   Smokeless tobacco: Never  Vaping Use   Vaping Use: Never used  Substance Use Topics   Alcohol use: No   Drug use: No     Allergies   Ciprofloxacin, Codeine, Metronidazole, and Sumatriptan   Review of Systems Review of Systems  Constitutional:  Negative for fever.  HENT:  Positive for congestion, ear pain, hearing loss and rhinorrhea. Negative for ear discharge and sore throat.   Respiratory:  Positive for cough and wheezing. Negative for shortness of breath.   Skin:  Negative for rash.  Neurological:  Positive for headaches.  Hematological: Negative.   Psychiatric/Behavioral: Negative.       Physical Exam Triage Vital Signs ED Triage Vitals  Enc Vitals Group     BP 04/01/22 1502 (!) 137/57     Pulse Rate 04/01/22 1502 (!) 53     Resp 04/01/22 1502 20     Temp 04/01/22 1502 97.7 F (36.5 C)     Temp Source 04/01/22 1502 Oral     SpO2 04/01/22 1502 96 %     Weight --      Height --      Head Circumference --      Peak Flow --      Pain Score 04/01/22 1459 6     Pain Loc --      Pain Edu? --      Excl. in [place]? --    No data found.  Updated Vital Signs BP (!) 137/57 (BP Location: Left Arm)   Pulse (!) 53   Temp 97.7 F (36.5 C) (Oral)   Resp 20   SpO2 96%   Visual Acuity Right Eye Distance:   Left Eye Distance:   Bilateral Distance:    Right Eye Near:   Left Eye Near:     Bilateral Near:     Physical Exam Vitals and nursing note reviewed.  Constitutional:      Appearance: Normal appearance. She is not ill-appearing.  HENT:     Head: Normocephalic and atraumatic.     Right Ear: Tympanic membrane, ear canal and external ear normal.     Left Ear: External ear normal. There is impacted cerumen.     Nose: Congestion and rhinorrhea present.  Mouth/Throat:     Mouth: Mucous membranes are moist.     Pharynx: Oropharynx is clear. Posterior oropharyngeal erythema present. No oropharyngeal exudate.  Cardiovascular:     Rate and Rhythm: Normal rate and regular rhythm.     Pulses: Normal pulses.     Heart sounds: Normal heart sounds. No murmur heard.    No friction rub. No gallop.  Pulmonary:     Effort: Pulmonary effort is normal.     Breath sounds: Normal breath sounds. No wheezing, rhonchi or rales.  Musculoskeletal:     Cervical back: Normal range of motion.  Lymphadenopathy:     Cervical: No cervical adenopathy.  Skin:    General: Skin is warm and dry.     Capillary Refill: Capillary refill takes less than 2 seconds.     Findings: No erythema or rash.  Neurological:     General: No focal deficit present.     Mental Status: She is alert and oriented to person, place, and time.  Psychiatric:        Mood and Affect: Mood normal.        Behavior: Behavior normal.        Thought Content: Thought content normal.        Judgment: Judgment normal.      UC Treatments / Results  Labs (all labs ordered are listed, but only abnormal results are displayed) Labs Reviewed  SARS CORONAVIRUS 2 BY RT PCR    EKG   Radiology No results found.  Procedures Procedures (including critical care time)  Medications Ordered in UC Medications - No data to display  Initial Impression / Assessment and Plan / UC Course  I have reviewed the triage vital signs and the nursing notes.  Pertinent labs & imaging results that were available during my care of the  patient were reviewed by me and considered in my medical decision making (see chart for details).   Patient is a very pleasant, nontoxic-appearing 81 year old female here for evaluation of upper respiratory symptoms and muffled hearing in her left ear with a feeling of fullness that been going on for the past 3 days.  On exam patient has a cerumen impaction in her left auditory canal.  The right external auditory canal is free of cerumen and the tympanic membrane is pearly gray with normal light reflex.  Nasal mucosa is erythematous and edematous with scant clear discharge in both nares.  Oropharyngeal exam reveals mild posterior oropharyngeal erythema without injection and clear postnasal drip.  No cervical lymphadenopathy appreciable exam.  Cardiopulmonary exam reveals S1-S2 heart sounds with regular rate and rhythm and lung sounds that are clear to auscultation all fields.  I will order a lavage of the left ear to remove the cerumen impaction and reevaluate for the presence of infection.  I will also order COVID PCR as patient has upper respiratory symptoms.  Reassessment of left ear following ear lavage reveals a pearly-gray tympanic membrane with normal light reflex.  The external auditory canal is now clear.  The canal is free of redness or irritation.  COVID PCR is negative.  I will discharge patient home with a diagnosis of cerumen impaction and viral URI with cough.  I will treat her symptoms with Atrovent nasal spray and Tessalon Perles.   Final Clinical Impressions(s) / UC Diagnoses   Final diagnoses:  Viral URI with cough  Impacted cerumen of left ear     Discharge Instructions      Your COVID test  today was negative but your exam does suggest that you have a viral upper respiratory infection.  Use the Tessalon Perles every 8 hours during the day as needed for cough.  Taken with a small sip of water.  You may get a numbness at the base of your tongue or metallic taste in your mouth,  this is normal.  Use the Atrovent nasal spray, 2 squirts up each nostril every 6 hours, as needed for nasal congestion, runny nose, and postnasal drip.  Return for reevaluation, or see your primary care provider, for new or continued symptoms.     ED Prescriptions     Medication Sig Dispense Auth. Provider   benzonatate (TESSALON) 100 MG capsule Take 2 capsules (200 mg total) by mouth every 8 (eight) hours. 21 capsule Margarette Canada, NP   ipratropium (ATROVENT) 0.06 % nasal spray Place 2 sprays into both nostrils 4 (four) times daily. 15 mL Margarette Canada, NP      PDMP not reviewed this encounter.   Margarette Canada, NP 04/01/22 1614

## 2022-04-01 NOTE — Discharge Instructions (Addendum)
Your COVID test today was negative but your exam does suggest that you have a viral upper respiratory infection.  Use the Tessalon Perles every 8 hours during the day as needed for cough.  Taken with a small sip of water.  You may get a numbness at the base of your tongue or metallic taste in your mouth, this is normal.  Use the Atrovent nasal spray, 2 squirts up each nostril every 6 hours, as needed for nasal congestion, runny nose, and postnasal drip.  Return for reevaluation, or see your primary care provider, for new or continued symptoms.

## 2022-04-01 NOTE — ED Triage Notes (Signed)
Left ear fullness and headache for 3 days

## 2022-05-02 ENCOUNTER — Ambulatory Visit (INDEPENDENT_AMBULATORY_CARE_PROVIDER_SITE_OTHER): Payer: Medicare Other | Admitting: Gastroenterology

## 2022-05-02 ENCOUNTER — Encounter: Payer: Self-pay | Admitting: Gastroenterology

## 2022-05-02 ENCOUNTER — Other Ambulatory Visit: Payer: Self-pay

## 2022-05-02 VITALS — BP 157/68 | HR 55 | Temp 98.0°F | Ht 65.0 in | Wt 147.0 lb

## 2022-05-02 DIAGNOSIS — K529 Noninfective gastroenteritis and colitis, unspecified: Secondary | ICD-10-CM | POA: Diagnosis not present

## 2022-05-02 DIAGNOSIS — R140 Abdominal distension (gaseous): Secondary | ICD-10-CM | POA: Diagnosis not present

## 2022-05-02 DIAGNOSIS — R634 Abnormal weight loss: Secondary | ICD-10-CM | POA: Diagnosis not present

## 2022-05-02 MED ORDER — NA SULFATE-K SULFATE-MG SULF 17.5-3.13-1.6 GM/177ML PO SOLN: 354.0000 mL | Freq: Once | ORAL | 0 refills | Status: AC

## 2022-05-02 NOTE — Progress Notes (Signed)
Cephas Darby, MD [address]  [place]  [place], [place] [address]  Main: [phone]  Fax: [phone]    Gastroenterology Consultation  Referring Provider:     Danae Orleans, MD Primary Care Physician:  Danae Orleans, MD Primary Gastroenterologist:  Dr. Cephas Darby Reason for Consultation:    Chronic diarrhea, abdominal bloating and unexplained weight loss    HPI:   Paula Daniels is a 81 y.o. female referred by Dr. Danae Orleans, MD  for consultation & management of abdominal pain and bloating.  Patient is here for follow-up of approximately 2 months history of progressively worsening generalized abdominal discomfort and abdominal bloating associated with severe constipation.  Patient reports that she has constipation for several years, has alternating episodes of constipation and diarrhea.  Patient has been taking laxatives, which are no longer effective.  Patient went to [facility] ER on 10/30/2020 secondary to severe abdominal pain, underwent CT abdomen pelvis with IV contrast which revealed colonic diverticulosis only.  Labs were otherwise unremarkable. Patient is accompanied by her daughter today.  Patient lives alone, independent of ADLs, socializes regularly.  She does admit to not eating healthy.  Her diet is devoid of fiber.  She does not like to cook just for herself.  Follow-up visit 06/07/2021 Patient is here for follow-up of left lower quadrant abdominal pain associated with abdominal bloating and diarrhea.  Patient was previously seen by me for severe constipation.  We discussed about high-fiber diet, cut back on processed foods, sugary drinks, carbonated beverages and also daily MiraLAX.  Patient reports that she was feeling well for few months as she was exhibiting more greens, cut back on processed foods, sugary drinks, carbonated beverages.  She continues to take MiraLAX.  For last 2 days, she reports having bouts of nonbloody diarrhea associated with left  lower abdominal discomfort and significant abdominal bloating.  She had hamburger on Monday and cereal yesterday.  Both days she has had bouts of diarrhea.  She denies any fever, chills, nausea or vomiting.  She is still taking MiraLAX once daily.  Her most recent labs from 05/2021 were unremarkable including TSH, CBC, CMP  Follow-up visit 10/04/2021 Paula Daniels is here for follow-up of ongoing diarrhea.  Patient reports that she has been experiencing nonbloody diarrhea, 3-4 times daily, right after she eats, she develops cramping in her right upper quadrant followed by diarrhea.  She does report abdominal bloating.  She denies rectal bleeding.  She is taking Pepto-Bismol as needed.  She stopped MiraLAX completely.  She drinks 1 Diet Coke daily.  She has been eating cheese daily hoping that it would help improve her diarrhea.  She lost about 10 pounds within the last 6 months.  Follow-up visit 05/02/2022 Patient is here for follow-up of chronic diarrhea associated with abdominal bloating and weight loss.  Patient further lost about 10 pounds since last visit.  She went to [facility] in May 2023, had a CT abdomen and pelvis with contrast which revealed diffuse sigmoid diverticulosis only with no evidence of diverticulitis.  There was no other intra-abdominal pathology.  Patient could not undergo labs that were recommended by me during last visit.  She could not drop off the stool specimen because she felt nauseous and vomited while she was collecting her stool sample.   She does not smoke or drink alcohol  NSAIDs: None  Antiplts/Anticoagulants/Anti thrombotics: None  GI Procedures:  Colonoscopy 01/18/2019 at [facility] Impression:        -  Diverticulosis in the recto-sigmoid colon, in the                     sigmoid colon and in the descending colon.                     - Diverticulosis in the ascending colon.                     - Stool in the entire examined colon.                     - One 5 mm polyp at the  recto-sigmoid colon, removed with                     a hot snare. Resected and retrieved.                     - Two 4 to 5 mm polyps in the descending colon, in the                     proximal descending colon and in the distal descending                     colon, removed with a cold snare. Complete resection.                     Partial retrieval.                     - Internal hemorrhoids.                     - The examined portion of the ileum was normal.                     No source of pain identified.   A: Colon, left, biopsy - Adenomatous polyp (multiple fragments), no high-grade dysplasia identified  Past Medical History:  Diagnosis Date   Anxiety    Cancer ([place])    Hypertension    Migraine     Past Surgical History:  Procedure Laterality Date   CATARACT EXTRACTION Bilateral    KNEE SURGERY Right     Current Outpatient Medications:    acetaminophen (TYLENOL) 500 MG tablet, Take 1 tablet by mouth daily., Disp: , Rfl:    ALPRAZolam (XANAX) 0.5 MG tablet, Take by mouth., Disp: , Rfl:    amitriptyline (ELAVIL) 25 MG tablet, Take by mouth., Disp: , Rfl:    atorvastatin (LIPITOR) 20 MG tablet, Take 1 tablet by mouth daily., Disp: , Rfl:    baclofen (LIORESAL) 10 MG tablet, Take 1 tablet (10 mg total) by mouth 3 (three) times daily., Disp: 30 each, Rfl: 0   benzonatate (TESSALON) 100 MG capsule, Take 2 capsules (200 mg total) by mouth every 8 (eight) hours., Disp: 21 capsule, Rfl: 0   Cholecalciferol (VITAMIN D-1000 MAX ST) 25 MCG (1000 UT) tablet, Take by mouth., Disp: , Rfl:    famotidine (PEPCID) 20 MG tablet, Take 20 mg by mouth at bedtime., Disp: , Rfl:    gabapentin (NEURONTIN) 300 MG capsule, Take by mouth., Disp: , Rfl:    ipratropium (ATROVENT) 0.06 % nasal spray, Place 2 sprays into both nostrils 4 (four) times daily., Disp: 15 mL, Rfl: 12   Na Sulfate-K Sulfate-Mg Sulf 17.5-3.13-1.6 GM/177ML SOLN, Take 354 mLs by mouth once  for 1 dose., Disp: 354 mL, Rfl: 0    omeprazole (PRILOSEC) 40 MG capsule, Take 40 mg by mouth daily., Disp: , Rfl:    potassium chloride SA (K-DUR,KLOR-CON) 20 MEQ tablet, Take 20 mEq by mouth daily., Disp: , Rfl:    propranolol (INDERAL) 40 MG tablet, Take by mouth., Disp: , Rfl:    topiramate (TOPAMAX) 50 MG tablet, Take 50 mg by mouth daily., Disp: , Rfl:    venlafaxine (EFFEXOR) 37.5 MG tablet, Take 37.5 mg by mouth daily., Disp: , Rfl:    Family History  Problem Relation Age of Onset   Colon cancer Mother    Heart attack Mother    Other Father        MVA     Social History   Tobacco Use   Smoking status: Never   Smokeless tobacco: Never  Vaping Use   Vaping Use: Never used  Substance Use Topics   Alcohol use: No   Drug use: No    Allergies as of 05/02/2022 - Review Complete 05/02/2022  Allergen Reaction Noted   Ciprofloxacin Itching and Rash 01/18/2014   Codeine Nausea Only and Nausea And Vomiting 12/03/2012   Metronidazole Rash and Itching 01/18/2014   Sumatriptan  07/17/2020    Review of Systems:    All systems reviewed and negative except where noted in HPI.   Physical Exam:  BP (!) 157/68 (BP Location: Left Arm, Patient Position: Sitting, Cuff Size: Normal)   Pulse (!) 55   Temp 98 F (36.7 C) (Oral)   Ht '5\' 5"'$  (1.651 m)   Wt 147 lb (66.7 kg)   BMI 24.46 kg/m  No LMP recorded. Patient has had a hysterectomy.  General:   Alert,  Well-developed, well-nourished, pleasant and cooperative in NAD Head:  Normocephalic and atraumatic. Eyes:  Sclera clear, no icterus.   Conjunctiva pink. Ears:  Normal auditory acuity. Nose:  No deformity, discharge, or lesions. Mouth:  No deformity or lesions,oropharynx pink & moist. Neck:  Supple; no masses or thyromegaly. Lungs:  Respirations even and unlabored.  Clear throughout to auscultation.   No wheezes, crackles, or rhonchi. No acute distress. Heart:  Regular rate and rhythm; no murmurs, clicks, rubs, or gallops. Abdomen:  Normal bowel sounds. Soft,  non-tender and diffusely distended, tympanic to percussion without masses, hepatosplenomegaly or hernias noted.  No guarding or rebound tenderness.   Rectal: Not performed Msk:  Symmetrical without gross deformities. Good, equal movement & strength bilaterally. Pulses:  Normal pulses noted. Extremities:  No clubbing or edema.  No cyanosis. Neurologic:  Alert and oriented x3;  grossly normal neurologically. Skin:  Intact without significant lesions or rashes. No jaundice. Psych:  Alert and cooperative. Normal mood and affect.  Imaging Studies: Reviewed  Assessment and Plan:   Paula Daniels is a 81 y.o. pleasant Caucasian female with history of anxiety, migraine, hypertension, severe left-sided colonic diverticulosis is seen in consultation for more than 6 months history of chronic nonbloody diarrhea, worse postprandial associated with right-sided abdominal pain, abdominal bloating, unintentional weight loss.  CT abdomen pelvis with contrast on 12/12/2021 at [facility] did not reveal any acute intra-abdominal pathology  Proceed with upper endoscopy with gastric and duodenal biopsies, and colonoscopy with possible TI evaluation and random colon biopsies. Further recommendations based on the above work-up  Follow up in 3 months   Cephas Darby, MD

## 2022-05-20 ENCOUNTER — Ambulatory Visit: Payer: Medicare Other | Admitting: Certified Registered"

## 2022-05-20 ENCOUNTER — Ambulatory Visit
Admission: RE | Admit: 2022-05-20 | Discharge: 2022-05-20 | Disposition: A | Discharge status: Home / Self Care | Payer: Medicare Other | Attending: Gastroenterology | Admitting: Gastroenterology

## 2022-05-20 ENCOUNTER — Encounter
Admission: RE | Disposition: A | Discharge status: Home / Self Care | Payer: Self-pay | Source: Home / Self Care | Attending: Gastroenterology

## 2022-05-20 ENCOUNTER — Encounter: Payer: Self-pay | Admitting: Gastroenterology

## 2022-05-20 DIAGNOSIS — I10 Essential (primary) hypertension: Secondary | ICD-10-CM | POA: Diagnosis not present

## 2022-05-20 DIAGNOSIS — Z6823 Body mass index (BMI) 23.0-23.9, adult: Secondary | ICD-10-CM | POA: Diagnosis not present

## 2022-05-20 DIAGNOSIS — K644 Residual hemorrhoidal skin tags: Secondary | ICD-10-CM | POA: Insufficient documentation

## 2022-05-20 DIAGNOSIS — K621 Rectal polyp: Secondary | ICD-10-CM

## 2022-05-20 DIAGNOSIS — F419 Anxiety disorder, unspecified: Secondary | ICD-10-CM | POA: Insufficient documentation

## 2022-05-20 DIAGNOSIS — D123 Benign neoplasm of transverse colon: Secondary | ICD-10-CM | POA: Diagnosis not present

## 2022-05-20 DIAGNOSIS — K529 Noninfective gastroenteritis and colitis, unspecified: Secondary | ICD-10-CM

## 2022-05-20 DIAGNOSIS — R197 Diarrhea, unspecified: Secondary | ICD-10-CM | POA: Diagnosis present

## 2022-05-20 DIAGNOSIS — K219 Gastro-esophageal reflux disease without esophagitis: Secondary | ICD-10-CM | POA: Diagnosis not present

## 2022-05-20 DIAGNOSIS — R519 Headache, unspecified: Secondary | ICD-10-CM | POA: Diagnosis not present

## 2022-05-20 DIAGNOSIS — K5730 Diverticulosis of large intestine without perforation or abscess without bleeding: Secondary | ICD-10-CM | POA: Insufficient documentation

## 2022-05-20 DIAGNOSIS — R140 Abdominal distension (gaseous): Secondary | ICD-10-CM

## 2022-05-20 DIAGNOSIS — K635 Polyp of colon: Secondary | ICD-10-CM

## 2022-05-20 DIAGNOSIS — Q438 Other specified congenital malformations of intestine: Secondary | ICD-10-CM | POA: Insufficient documentation

## 2022-05-20 DIAGNOSIS — R634 Abnormal weight loss: Secondary | ICD-10-CM | POA: Diagnosis not present

## 2022-05-20 HISTORY — PX: COLONOSCOPY WITH PROPOFOL: SHX5780

## 2022-05-20 HISTORY — PX: ESOPHAGOGASTRODUODENOSCOPY (EGD) WITH PROPOFOL: SHX5813

## 2022-05-20 SURGERY — COLONOSCOPY WITH PROPOFOL
Anesthesia: General

## 2022-05-20 MED ORDER — LIDOCAINE HCL (CARDIAC) PF 100 MG/5ML IV SOSY
PREFILLED_SYRINGE | INTRAVENOUS | Status: DC | PRN
  Administered 2022-05-20: 80 mg via INTRAVENOUS

## 2022-05-20 MED ORDER — PROPOFOL 10 MG/ML IV BOLUS
INTRAVENOUS | Status: DC | PRN
  Administered 2022-05-20 (×2): 40 mg via INTRAVENOUS
  Administered 2022-05-20: 90 mg via INTRAVENOUS
  Administered 2022-05-20: 40 mg via INTRAVENOUS
  Administered 2022-05-20: 30 mg via INTRAVENOUS
  Administered 2022-05-20 (×7): 40 mg via INTRAVENOUS

## 2022-05-20 MED ORDER — SODIUM CHLORIDE 0.9 % IV SOLN
INTRAVENOUS | Status: DC
  Administered 2022-05-20: 1000 mL via INTRAVENOUS

## 2022-05-20 NOTE — Op Note (Signed)
[facility] Gastroenterology Patient Name: Paula Daniels Procedure Date: 05/20/2022 10:57 AM MRN: [id number] Account #: [id number] Date of Birth: 06-03-1941 Admit Type: Outpatient Age: 81 Room: [facility] ENDO ROOM 4 Gender: Female Note Status: Finalized Instrument Name: Upper Endoscope [id number] Procedure:             Upper GI endoscopy Indications:           Abdominal bloating, Diarrhea, Weight loss Providers:             Lin Landsman MD, MD Referring MD:          Danae Orleans MD (Referring MD) Medicines:             General Anesthesia Complications:         No immediate complications. Estimated blood loss: None. Procedure:             Pre-Anesthesia Assessment:                        - Prior to the procedure, a History and Physical was                         performed, and patient medications and allergies were                         reviewed. The patient is competent. The risks and                         benefits of the procedure and the sedation options and                         risks were discussed with the patient. All questions                         were answered and informed consent was obtained.                         Patient identification and proposed procedure were                         verified by the physician, the nurse, the                         anesthesiologist, the anesthetist and the technician                         in the pre-procedure area in the procedure room in the                         endoscopy suite. Mental Status Examination: alert and                         oriented. Airway Examination: normal oropharyngeal                         airway and neck mobility. Respiratory Examination:                         clear to auscultation. CV Examination: normal.  Prophylactic Antibiotics: The patient does not require                         prophylactic antibiotics. Prior Anticoagulants: The                          patient has taken no previous anticoagulant or                         antiplatelet agents. ASA Grade Assessment: II - A                         patient with mild systemic disease. After reviewing                         the risks and benefits, the patient was deemed in                         satisfactory condition to undergo the procedure. The                         anesthesia plan was to use general anesthesia.                         Immediately prior to administration of medications,                         the patient was re-assessed for adequacy to receive                         sedatives. The heart rate, respiratory rate, oxygen                         saturations, blood pressure, adequacy of pulmonary                         ventilation, and response to care were monitored                         throughout the procedure. The physical status of the                         patient was re-assessed after the procedure.                        After obtaining informed consent, the endoscope was                         passed under direct vision. Throughout the procedure,                         the patient's blood pressure, pulse, and oxygen                         saturations were monitored continuously. The Endoscope                         was introduced through the mouth, and advanced to the  second part of duodenum. The upper GI endoscopy was                         accomplished without difficulty. The patient tolerated                         the procedure well. Findings:      The duodenal bulb and second portion of the duodenum were normal.       Biopsies were taken with a cold forceps for histology.      The entire examined stomach was normal. Biopsies were taken with a cold       forceps for histology.      The cardia and gastric fundus were normal on retroflexion.      Esophagogastric landmarks were identified: the gastroesophageal junction        was found at 39 cm from the incisors.      The gastroesophageal junction and examined esophagus were normal. Impression:            - Normal duodenal bulb and second portion of the                         duodenum. Biopsied.                        - Normal stomach. Biopsied.                        - Esophagogastric landmarks identified.                        - Normal gastroesophageal junction and esophagus. Recommendation:        - Await pathology results.                        - Proceed with colonoscopy as scheduled                        See colonoscopy report Procedure Code(s):     --- Professional ---                        [phone], Esophagogastroduodenoscopy, flexible,                         transoral; with biopsy, single or multiple Diagnosis Code(s):     --- Professional ---                        R14.0, Abdominal distension (gaseous)                        R19.7, Diarrhea, unspecified                        R63.4, Abnormal weight loss CPT copyright 2019 American Medical Association. All rights reserved. The codes documented in this report are preliminary and upon coder review may  be revised to meet current compliance requirements. Dr. Ulyess Mort Lin Landsman MD, MD 05/20/2022 11:19:58 AM This report has been signed electronically. Number of Addenda: 0 Note Initiated On: 05/20/2022 10:57 AM Estimated Blood Loss:  Estimated blood loss: none.      [place]  [facility]

## 2022-05-20 NOTE — Op Note (Signed)
[facility] Gastroenterology Patient Name: Paula Daniels Procedure Date: 05/20/2022 10:56 AM MRN: [id number] Account #: [id number] Date of Birth: 27-Sep-1940 Admit Type: Outpatient Age: 81 Room: [facility] ENDO ROOM 4 Gender: Female Note Status: Finalized Instrument Name: Park Meo [id number] Procedure:             Colonoscopy Indications:           Clinically significant diarrhea of unexplained origin,                         Weight loss Providers:             Lin Landsman MD, MD Referring MD:          Danae Orleans MD (Referring MD) Medicines:             General Anesthesia Complications:         No immediate complications. Estimated blood loss: None. Procedure:             Pre-Anesthesia Assessment:                        - Prior to the procedure, a History and Physical was                         performed, and patient medications and allergies were                         reviewed. The patient is competent. The risks and                         benefits of the procedure and the sedation options and                         risks were discussed with the patient. All questions                         were answered and informed consent was obtained.                         Patient identification and proposed procedure were                         verified by the physician, the nurse, the                         anesthesiologist, the anesthetist and the technician                         in the pre-procedure area in the procedure room in the                         endoscopy suite. Mental Status Examination: alert and                         oriented. Airway Examination: normal oropharyngeal                         airway and neck mobility. Respiratory Examination:  clear to auscultation. CV Examination: normal.                         Prophylactic Antibiotics: The patient does not require                         prophylactic antibiotics.  Prior Anticoagulants: The                         patient has taken no previous anticoagulant or                         antiplatelet agents. ASA Grade Assessment: II - A                         patient with mild systemic disease. After reviewing                         the risks and benefits, the patient was deemed in                         satisfactory condition to undergo the procedure. The                         anesthesia plan was to use general anesthesia.                         Immediately prior to administration of medications,                         the patient was re-assessed for adequacy to receive                         sedatives. The heart rate, respiratory rate, oxygen                         saturations, blood pressure, adequacy of pulmonary                         ventilation, and response to care were monitored                         throughout the procedure. The physical status of the                         patient was re-assessed after the procedure.                        After obtaining informed consent, the colonoscope was                         passed under direct vision. Throughout the procedure,                         the patient's blood pressure, pulse, and oxygen                         saturations were monitored continuously. The  Colonoscope was introduced through the anus and                         advanced to the the terminal ileum, with                         identification of the appendiceal orifice and IC                         valve. The colonoscopy was performed with moderate                         difficulty due to inadequate bowel prep and                         significant looping. Successful completion of the                         procedure was aided by applying abdominal pressure.                         The patient tolerated the procedure well. The quality                         of the bowel preparation was  fair. Findings:      The perianal and digital rectal examinations were normal. Pertinent       negatives include normal sphincter tone and no palpable rectal lesions.      The terminal ileum appeared normal.      A 9 mm polyp was found in the transverse colon. The polyp was sessile.       The polyp was removed with a hot snare. Resection and retrieval were       complete. To prevent bleeding after the polypectomy, two hemostatic       clips were successfully placed. There was no bleeding during, or at the       end, of the procedure.      Two sessile polyps were found in the transverse colon. The polyps were 3       to 4 mm in size. These polyps were removed with a cold snare. Resection       and retrieval were complete. Estimated blood loss: none.      Multiple small and large-mouthed diverticula were found in the       recto-sigmoid colon and sigmoid colon.      Normal mucosa was found in the entire colon. Biopsies for histology were       taken with a cold forceps from the entire colon for evaluation of       microscopic colitis.      Non-bleeding external hemorrhoids were found during retroflexion. The       hemorrhoids were moderate. Impression:            - Preparation of the colon was fair.                        - The examined portion of the ileum was normal.                        - One 9 mm polyp in the transverse  colon, removed with                         a hot snare. Resected and retrieved. Clips were placed.                        - Two 3 to 4 mm polyps in the transverse colon,                         removed with a cold snare. Resected and retrieved.                        - Diverticulosis in the recto-sigmoid colon and in the                         sigmoid colon.                        - Normal mucosa in the entire examined colon. Biopsied.                        - Non-bleeding external hemorrhoids. Recommendation:        - Discharge patient to home (with escort).                         - Resume previous diet today.                        - Continue present medications.                        - Await pathology results. Procedure Code(s):     --- Professional ---                        [phone], Colonoscopy, flexible; with removal of                         tumor(s), polyp(s), or other lesion(s) by snare                         technique                        [id number], 59, Colonoscopy, flexible; with biopsy, single                         or multiple Diagnosis Code(s):     --- Professional ---                        K64.4, Residual hemorrhoidal skin tags                        K63.5, Polyp of colon                        R19.7, Diarrhea, unspecified                        R63.4, Abnormal weight loss  K57.30, Diverticulosis of large intestine without                         perforation or abscess without bleeding CPT copyright 2019 American Medical Association. All rights reserved. The codes documented in this report are preliminary and upon coder review may  be revised to meet current compliance requirements. Dr. Ulyess Mort Lin Landsman MD, MD 05/20/2022 11:58:49 AM This report has been signed electronically. Number of Addenda: 0 Note Initiated On: 05/20/2022 10:56 AM Scope Withdrawal Time: 0 hours 29 minutes 0 seconds  Total Procedure Duration: 0 hours 34 minutes 1 second  Estimated Blood Loss:  Estimated blood loss: none.      [facility]

## 2022-05-20 NOTE — Anesthesia Preprocedure Evaluation (Signed)
Anesthesia Evaluation  Patient identified by MRN, date of birth, ID band Patient awake    Reviewed: Allergy & Precautions, NPO status , Patient's Chart, lab work & pertinent test results  Airway Mallampati: III  TM Distance: >3 FB Neck ROM: Full    Dental  (+) Teeth Intact   Pulmonary neg pulmonary ROS,    Pulmonary exam normal breath sounds clear to auscultation       Cardiovascular Exercise Tolerance: Good hypertension, Pt. on medications negative cardio ROS Normal cardiovascular exam Rhythm:Regular Rate:Normal     Neuro/Psych  Headaches, Anxiety negative neurological ROS  negative psych ROS   GI/Hepatic negative GI ROS, Neg liver ROS, GERD  Medicated,  Endo/Other  negative endocrine ROS  Renal/GU negative Renal ROS  negative genitourinary   Musculoskeletal   Abdominal Normal abdominal exam  (+)   Peds negative pediatric ROS (+)  Hematology negative hematology ROS (+)   Anesthesia Other Findings Past Medical History: No date: Anxiety No date: Cancer ([place]) No date: Hypertension No date: Migraine  Past Surgical History: No date: BACK SURGERY No date: CATARACT EXTRACTION; Bilateral No date: EYE SURGERY No date: JOINT REPLACEMENT No date: KNEE SURGERY; Right  BMI    Body Mass Index: 23.34 kg/m      Reproductive/Obstetrics negative OB ROS                             Anesthesia Physical Anesthesia Plan  ASA: 2  Anesthesia Plan: General   Post-op Pain Management:    Induction: Intravenous  PONV Risk Score and Plan: Propofol infusion and TIVA  Airway Management Planned: Natural Airway  Additional Equipment:   Intra-op Plan:   Post-operative Plan:   Informed Consent: I have reviewed the patients History and Physical, chart, labs and discussed the procedure including the risks, benefits and alternatives for the proposed anesthesia with the patient or authorized  representative who has indicated his/her understanding and acceptance.     Dental Advisory Given  Plan Discussed with: CRNA and Surgeon  Anesthesia Plan Comments:         Anesthesia Quick Evaluation

## 2022-05-20 NOTE — Anesthesia Postprocedure Evaluation (Signed)
Anesthesia Post Note  Patient: Paula Daniels  Procedure(s) Performed: COLONOSCOPY WITH PROPOFOL ESOPHAGOGASTRODUODENOSCOPY (EGD) WITH PROPOFOL  Patient location during evaluation: PACU Anesthesia Type: General Level of consciousness: awake and oriented Pain management: pain level controlled Vital Signs Assessment: post-procedure vital signs reviewed and stable Respiratory status: spontaneous breathing and respiratory function stable Cardiovascular status: stable Anesthetic complications: no   No notable events documented.   Last Vitals:  Vitals:   05/20/22 1208 05/20/22 1218  BP: (!) 142/65 (!) 165/66  Pulse: 61 (!) 59  Resp: 12 15  Temp:    SpO2: 98% 100%    Last Pain:  Vitals:   05/20/22 1218  TempSrc:   PainSc: 0-No pain                 VAN STAVEREN,Estel Tonelli

## 2022-05-20 NOTE — Transfer of Care (Signed)
Immediate Anesthesia Transfer of Care Note  Patient: Paula Daniels  Procedure(s) Performed: COLONOSCOPY WITH PROPOFOL ESOPHAGOGASTRODUODENOSCOPY (EGD) WITH PROPOFOL  Patient Location: Endoscopy Unit  Anesthesia Type:General  Level of Consciousness: drowsy  Airway & Oxygen Therapy: Patient Spontanous Breathing and Patient connected to nasal cannula oxygen  Post-op Assessment: Report given to RN, Post -op Vital signs reviewed and stable and Patient moving all extremities  Post vital signs: Reviewed and stable  Last Vitals:  Vitals Value Taken Time  BP 134/83 05/20/22 1158  Temp 36 C 05/20/22 1158  Pulse 64 05/20/22 1158  Resp 11 05/20/22 1158  SpO2 100 % 05/20/22 1158    Last Pain:  Vitals:   05/20/22 1158  TempSrc: Temporal  PainSc: Asleep         Complications: No notable events documented.

## 2022-05-20 NOTE — H&P (Signed)
Cephas Darby, MD [address]  [place]  [place], [place] [address]  Main: [phone]  Fax: [phone] Pager: [phone]  Primary Care Physician:  Danae Orleans, MD Primary Gastroenterologist:  Dr. Cephas Darby  Pre-Procedure History & Physical: HPI:  Paula Daniels is a 81 y.o. female is here for an endoscopy and colonoscopy.   Past Medical History:  Diagnosis Date   Anxiety    Cancer ([place])    Hypertension    Migraine     Past Surgical History:  Procedure Laterality Date   BACK SURGERY     CATARACT EXTRACTION Bilateral    EYE SURGERY     JOINT REPLACEMENT     KNEE SURGERY Right     Prior to Admission medications   Medication Sig Start Date End Date Taking? Authorizing Provider  acetaminophen (TYLENOL) 500 MG tablet Take 1 tablet by mouth daily.    [provider]  ALPRAZolam Duanne Moron) 0.5 MG tablet Take by mouth. 04/10/22   [provider]  amitriptyline (ELAVIL) 25 MG tablet Take by mouth. 12/09/21 12/10/22  [provider]  atorvastatin (LIPITOR) 20 MG tablet Take 1 tablet by mouth daily. 02/06/22 02/06/23  [provider]  baclofen (LIORESAL) 10 MG tablet Take 1 tablet (10 mg total) by mouth 3 (three) times daily. 07/07/21   Rodriguez-Southworth, Sunday Spillers, PA-C  benzonatate (TESSALON) 100 MG capsule Take 2 capsules (200 mg total) by mouth every 8 (eight) hours. 04/01/22   Margarette Canada, NP  Cholecalciferol (VITAMIN D-1000 MAX ST) 25 MCG (1000 UT) tablet Take by mouth.    [provider]  famotidine (PEPCID) 20 MG tablet Take 20 mg by mouth at bedtime. 02/15/22   [provider]  gabapentin (NEURONTIN) 300 MG capsule Take by mouth. 11/19/21 11/19/22  [provider]  ipratropium (ATROVENT) 0.06 % nasal spray Place 2 sprays into both nostrils 4 (four) times daily. 04/01/22   Margarette Canada, NP  omeprazole (PRILOSEC) 40 MG capsule Take 40 mg by mouth daily.    [provider]  potassium chloride SA  (K-DUR,KLOR-CON) 20 MEQ tablet Take 20 mEq by mouth daily.    [provider]  propranolol (INDERAL) 40 MG tablet Take by mouth. 12/31/21 01/01/23  [provider]  topiramate (TOPAMAX) 50 MG tablet Take 50 mg by mouth daily. 03/07/22   [provider]  venlafaxine (EFFEXOR) 37.5 MG tablet Take 37.5 mg by mouth daily.    [provider]    Allergies as of 05/03/2022 - Review Complete 05/02/2022  Allergen Reaction Noted   Ciprofloxacin Itching and Rash 01/18/2014   Codeine Nausea Only and Nausea And Vomiting 12/03/2012   Metronidazole Rash and Itching 01/18/2014   Sumatriptan  07/17/2020    Family History  Problem Relation Age of Onset   Colon cancer Mother    Heart attack Mother    Other Father        MVA    Social History   Socioeconomic History   Marital status: Married    Spouse name: Not on file   Number of children: Not on file   Years of education: Not on file   Highest education level: Not on file  Occupational History   Not on file  Tobacco Use   Smoking status: Never   Smokeless tobacco: Never  Vaping Use   Vaping Use: Never used  Substance and Sexual Activity   Alcohol use: No   Drug use: No   Sexual activity: Not on  file  Other Topics Concern   Not on file  Social History Narrative   Not on file   Social Determinants of Health   Financial Resource Strain: Not on file  Food Insecurity: Not on file  Transportation Needs: Not on file  Physical Activity: Not on file  Stress: Not on file  Social Connections: Not on file  Intimate Partner Violence: Not on file    Review of Systems: See HPI, otherwise negative ROS  Physical Exam: BP (!) 165/71   Pulse 68   Temp (!) 96.3 F (35.7 C) (Temporal)   Resp 16   Ht '5\' 5"'$  (1.651 m)   Wt 63.6 kg   SpO2 100%   BMI 23.34 kg/m  General:   Alert,  pleasant and cooperative in NAD Head:  Normocephalic and atraumatic. Neck:  Supple; no masses or thyromegaly. Lungs:  Clear  throughout to auscultation.    Heart:  Regular rate and rhythm. Abdomen:  Soft, nontender and nondistended. Normal bowel sounds, without guarding, and without rebound.   Neurologic:  Alert and  oriented x4;  grossly normal neurologically.  Impression/Plan: Paula Daniels is here for an endoscopy and colonoscopy to be performed for  Chronic diarrhea, abdominal bloating and unexplained weight loss  Risks, benefits, limitations, and alternatives regarding  endoscopy and colonoscopy have been reviewed with the patient.  Questions have been answered.  All parties agreeable.   Sherri Sear, MD  05/20/2022, 11:02 AM

## 2022-05-21 ENCOUNTER — Encounter: Payer: Self-pay | Admitting: Gastroenterology

## 2022-05-21 LAB — SURGICAL PATHOLOGY

## 2022-05-22 ENCOUNTER — Telehealth: Payer: Self-pay

## 2022-05-22 NOTE — Telephone Encounter (Signed)
-----   Message from Lin Landsman, MD sent at 05/21/2022  3:29 PM EDT ----- Pathology results from upper endoscopy and colonoscopy does not reveal any inflammation to explain her diarrhea, bloating and weight loss.  I still recommend GI profile PCR to rule out any infection as well as check pancreatic fecal elastase levels Chronic diarrhea  RV

## 2022-05-22 NOTE — Telephone Encounter (Signed)
Patient verbalized understanding of results she states she can not do the stool test because she has tried to collect stool samples in the past and was not able to. She states she will not try again

## 2022-09-03 ENCOUNTER — Ambulatory Visit: Payer: Medicare Other | Admitting: Gastroenterology

## 2022-10-24 ENCOUNTER — Emergency Department
Admission: EM | Admit: 2022-10-24 | Discharge: 2022-10-24 | Disposition: A | Discharge status: Home / Self Care | Payer: Medicare Other | Attending: Emergency Medicine | Admitting: Emergency Medicine

## 2022-10-24 ENCOUNTER — Ambulatory Visit (INDEPENDENT_AMBULATORY_CARE_PROVIDER_SITE_OTHER): Payer: Medicare Other

## 2022-10-24 ENCOUNTER — Ambulatory Visit
Admission: EM | Admit: 2022-10-24 | Discharge: 2022-10-24 | Disposition: A | Discharge status: Home / Self Care | Payer: Medicare Other | Source: Home / Self Care

## 2022-10-24 ENCOUNTER — Other Ambulatory Visit: Payer: Self-pay

## 2022-10-24 DIAGNOSIS — B974 Respiratory syncytial virus as the cause of diseases classified elsewhere: Secondary | ICD-10-CM | POA: Diagnosis not present

## 2022-10-24 DIAGNOSIS — R112 Nausea with vomiting, unspecified: Secondary | ICD-10-CM | POA: Diagnosis present

## 2022-10-24 DIAGNOSIS — J210 Acute bronchiolitis due to respiratory syncytial virus: Secondary | ICD-10-CM | POA: Insufficient documentation

## 2022-10-24 DIAGNOSIS — I10 Essential (primary) hypertension: Secondary | ICD-10-CM | POA: Insufficient documentation

## 2022-10-24 DIAGNOSIS — R0602 Shortness of breath: Secondary | ICD-10-CM | POA: Diagnosis not present

## 2022-10-24 DIAGNOSIS — R197 Diarrhea, unspecified: Secondary | ICD-10-CM | POA: Diagnosis not present

## 2022-10-24 DIAGNOSIS — Z1152 Encounter for screening for COVID-19: Secondary | ICD-10-CM | POA: Insufficient documentation

## 2022-10-24 DIAGNOSIS — Z8249 Family history of ischemic heart disease and other diseases of the circulatory system: Secondary | ICD-10-CM | POA: Insufficient documentation

## 2022-10-24 DIAGNOSIS — Z8551 Personal history of malignant neoplasm of bladder: Secondary | ICD-10-CM | POA: Insufficient documentation

## 2022-10-24 DIAGNOSIS — B338 Other specified viral diseases: Secondary | ICD-10-CM

## 2022-10-24 LAB — RESP PANEL BY RT-PCR (RSV, FLU A&B, COVID)  RVPGX2
Influenza A by PCR: NEGATIVE
Influenza B by PCR: NEGATIVE
Resp Syncytial Virus by PCR: POSITIVE — AB
SARS Coronavirus 2 by RT PCR: NEGATIVE

## 2022-10-24 LAB — CBC WITH DIFFERENTIAL/PLATELET
Abs Immature Granulocytes: 0.02 10*3/uL (ref 0.00–0.07)
Basophils Absolute: 0 10*3/uL (ref 0.0–0.1)
Basophils Relative: 0 %
Eosinophils Absolute: 0.1 10*3/uL (ref 0.0–0.5)
Eosinophils Relative: 2 %
HCT: 37.4 % (ref 36.0–46.0)
Hemoglobin: 11.9 g/dL — ABNORMAL LOW (ref 12.0–15.0)
Immature Granulocytes: 0 %
Lymphocytes Relative: 18 %
Lymphs Abs: 1.2 10*3/uL (ref 0.7–4.0)
MCH: 31.2 pg (ref 26.0–34.0)
MCHC: 31.8 g/dL (ref 30.0–36.0)
MCV: 97.9 fL (ref 80.0–100.0)
Monocytes Absolute: 0.4 10*3/uL (ref 0.1–1.0)
Monocytes Relative: 6 %
Neutro Abs: 5.1 10*3/uL (ref 1.7–7.7)
Neutrophils Relative %: 74 %
Platelets: 127 10*3/uL — ABNORMAL LOW (ref 150–400)
RBC: 3.82 MIL/uL — ABNORMAL LOW (ref 3.87–5.11)
RDW: 12.9 % (ref 11.5–15.5)
WBC: 6.9 10*3/uL (ref 4.0–10.5)
nRBC: 0 % (ref 0.0–0.2)

## 2022-10-24 LAB — COMPREHENSIVE METABOLIC PANEL
ALT: 10 U/L (ref 0–44)
AST: 18 U/L (ref 15–41)
Albumin: 3.8 g/dL (ref 3.5–5.0)
Alkaline Phosphatase: 64 U/L (ref 38–126)
Anion gap: 9 (ref 5–15)
BUN: 14 mg/dL (ref 8–23)
CO2: 23 mmol/L (ref 22–32)
Calcium: 8.1 mg/dL — ABNORMAL LOW (ref 8.9–10.3)
Chloride: 105 mmol/L (ref 98–111)
Creatinine, Ser: 0.75 mg/dL (ref 0.44–1.00)
GFR, Estimated: >60 mL/min (ref >60)
Glucose, Bld: 111 mg/dL — ABNORMAL HIGH (ref 70–99)
Potassium: 3.3 mmol/L — ABNORMAL LOW (ref 3.5–5.1)
Sodium: 137 mmol/L (ref 135–145)
Total Bilirubin: 0.9 mg/dL (ref 0.3–1.2)
Total Protein: 6.7 g/dL (ref 6.5–8.1)

## 2022-10-24 LAB — URINALYSIS, ROUTINE W REFLEX MICROSCOPIC
Bilirubin Urine: NEGATIVE
Glucose, UA: NEGATIVE mg/dL
Hgb urine dipstick: NEGATIVE
Ketones, ur: 20 mg/dL — AB
Leukocytes,Ua: NEGATIVE
Nitrite: NEGATIVE
Protein, ur: NEGATIVE mg/dL
Specific Gravity, Urine: 1.019 (ref 1.005–1.030)
pH: 6 (ref 5.0–8.0)

## 2022-10-24 MED ORDER — ONDANSETRON HCL 4 MG/2ML IJ SOLN
4.0000 mg | Freq: Once | INTRAMUSCULAR | Status: AC
  Administered 2022-10-24: 4 mg via INTRAVENOUS
  Filled 2022-10-24: qty 2

## 2022-10-24 MED ORDER — AEROCHAMBER MV MISC: 2 refills | Status: AC

## 2022-10-24 MED ORDER — ONDANSETRON HCL 4 MG PO TABS: 4.0000 mg | ORAL_TABLET | Freq: Three times a day (TID) | ORAL | 0 refills | Status: DC | PRN

## 2022-10-24 MED ORDER — SODIUM CHLORIDE 0.9 % IV BOLUS
500.0000 mL | Freq: Once | INTRAVENOUS | Status: AC
  Administered 2022-10-24: 500 mL via INTRAVENOUS

## 2022-10-24 MED ORDER — ONDANSETRON HCL 4 MG PO TABS: 4.0000 mg | ORAL_TABLET | Freq: Three times a day (TID) | ORAL | 0 refills | Status: AC | PRN

## 2022-10-24 MED ORDER — BENZONATATE 100 MG PO CAPS: 200.0000 mg | ORAL_CAPSULE | Freq: Three times a day (TID) | ORAL | 0 refills | Status: AC

## 2022-10-24 MED ORDER — IPRATROPIUM BROMIDE 0.06 % NA SOLN: 2.0000 | Freq: Four times a day (QID) | NASAL | 12 refills | Status: AC

## 2022-10-24 MED ORDER — ALBUTEROL SULFATE HFA 108 (90 BASE) MCG/ACT IN AERS: 2.0000 | INHALATION_SPRAY | RESPIRATORY_TRACT | 0 refills | Status: AC | PRN

## 2022-10-24 NOTE — ED Triage Notes (Signed)
Pt c/o cough, chest congestion, runny nose onset last Sunday with cough. Pt states cough has worsened, pt denies any sick contacts. Pt denies any fevers

## 2022-10-24 NOTE — ED Triage Notes (Signed)
Reports n/v/d. Onset today. Seen at [facility] UC earlier today and dx with RSV. Denies chest pain or pressure. Denies SOB. Pt alert and oriented following commands. Breathing unlabored speaking in full sentences.

## 2022-10-24 NOTE — ED Notes (Signed)
Pt to room 8 via w/c with no distress noted; placed on card monitor, call bed in reach; care nurse Othelia Pulling notified of pt arrival

## 2022-10-24 NOTE — Discharge Instructions (Addendum)
Also for RSV today.  This is a respiratory virus that will have to run its course and typically take 7 to 10 days.  Use the albuterol inhaler with a spacer, 1 to 2 puffs every 4-6 hours, as needed for shortness of breath or wheezing.  Use the Tessalon Perles every 8 hours during the day as needed for cough.  You may take them with a small sip of water.  They may give you numbness to the base of your tongue or metallic taste in her mouth, this is normal.  Use over-the-counter cough preparations such as Delsym, Robitussin, or Zarbee's according to the package instructions to help with cough and congestion.  Use the Atrovent nasal spray, 2 squirts of each nostril every 6 hours, as needed for nasal congestion and postnasal drip.  If you develop any new or worsening symptoms either return for reevaluation, see your primary care provider, or seek care in the ER.

## 2022-10-24 NOTE — ED Triage Notes (Signed)
EMS brings pt in from home; dx RSV this am; c/o N/V/D

## 2022-10-24 NOTE — ED Provider Notes (Signed)
[facility]    CSN: [id number] Arrival date & time: 10/24/22  [id number]      History   Chief Complaint Chief Complaint  Patient presents with   Cough   Nasal Congestion   Shortness of Breath    HPI Paula Daniels is a 82 y.o. female.   HPI  82 year old female here for evaluation of respiratory complaints.  Patient has a past medical history that is significant for hypertension, anxiety, migraines, and bladder cancer presenting for evaluation of 4 days worth of nasal congestion with postnasal drip, scratchy throat, nonproductive cough, and wheezing.  She states that she is experiencing tightness in her chest.  She denies any fever, shortness of breath, or GI symptoms.  She is unaware of any sick contacts as she lives alone and does not tend to go to the grocery store or out in public.  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   Essential hypertension, benign 04/20/2018   Anxiety, generalized 04/14/2018   HTN, goal below 140/90 04/14/2018   Primary osteoarthritis of both knees 04/14/2018   Degenerative spondylolisthesis 12/17/2016   Spinal stenosis of lumbar 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 claudication 11/08/2016   Benign essential tremor 02/28/2015   Chronic migraine without aura without status migrainosus, not intractable 07/25/2014   Dyslipidemia, goal LDL below 130 03/15/2013    Gastro-esophageal reflux disease without esophagitis 12/03/2012   Contact dermatitis and other eczema due to other specified agent 04/20/2012   Primary localized osteoarthrosis, lower leg 03/06/2012   Derangement of medial meniscus 12/31/2011   Diverticulosis of large intestine without perforation or abscess without bleeding 05/03/2009    Past Surgical History:  Procedure Laterality Date   BACK SURGERY     CATARACT EXTRACTION Bilateral    COLONOSCOPY WITH PROPOFOL N/A 05/20/2022   Procedure: COLONOSCOPY WITH PROPOFOL;  Surgeon: Lin Landsman, MD;  Location: [facility];  Service: Gastroenterology;  Laterality: N/A;   ESOPHAGOGASTRODUODENOSCOPY (EGD) WITH PROPOFOL N/A 05/20/2022   Procedure: ESOPHAGOGASTRODUODENOSCOPY (EGD) WITH PROPOFOL;  Surgeon: Lin Landsman, MD;  Location: [facility] ENDOSCOPY;  Service: Gastroenterology;  Laterality: N/A;   EYE SURGERY     JOINT REPLACEMENT     KNEE SURGERY Right     OB History   No obstetric history on file.      Home Medications    Prior to Admission medications   Medication Sig Start Date End Date Taking? Authorizing Provider  albuterol (VENTOLIN HFA) 108 (90 Base) MCG/ACT inhaler Inhale 2 puffs into the lungs every 4 (four) hours as needed. 10/24/22  Yes Margarette Canada, NP  benzonatate (TESSALON) 100 MG capsule Take 2 capsules (200 mg total) by mouth every 8 (eight) hours. 10/24/22  Yes Margarette Canada, NP  ipratropium (ATROVENT) 0.06 % nasal spray Place 2 sprays into both nostrils 4 (four) times daily. 10/24/22  Yes Margarette Canada, NP  Spacer/Aero-Holding Chambers (AEROCHAMBER MV) inhaler Use as instructed 10/24/22  Yes Margarette Canada, NP  acetaminophen (TYLENOL) 500 MG tablet Take 1 tablet by mouth daily.    [provider]  ALPRAZolam Duanne Moron) 0.5 MG tablet Take by mouth. 04/10/22   [provider]  amitriptyline (ELAVIL) 25 MG tablet Take by mouth. 12/09/21 12/10/22  [provider]  atorvastatin (LIPITOR) 20 MG  tablet Take 1 tablet by mouth daily. 02/06/22 02/06/23  [provider]  baclofen (LIORESAL) 10 MG tablet Take 1 tablet (10 mg total) by mouth 3 (three) times daily. 07/07/21   Rodriguez-Southworth, Sunday Spillers, PA-C  Cholecalciferol (VITAMIN D-1000 MAX ST) 25 MCG (1000 UT) tablet Take by mouth.    [provider]  famotidine (PEPCID) 20 MG tablet Take 20 mg by mouth at bedtime. 02/15/22   [provider]  gabapentin (NEURONTIN) 300 MG capsule Take by mouth. 11/19/21 11/19/22  [provider]  omeprazole (PRILOSEC) 40 MG capsule Take 40 mg by mouth daily.    [provider]  potassium chloride SA (K-DUR,KLOR-CON) 20 MEQ tablet Take 20 mEq by mouth daily.    [provider]  propranolol (INDERAL) 40 MG tablet Take by mouth. 12/31/21 01/01/23  [provider]  topiramate (TOPAMAX) 50 MG tablet Take 50 mg by mouth daily. 03/07/22   [provider]  venlafaxine (EFFEXOR) 37.5 MG tablet Take 37.5 mg by mouth daily.    [provider]    Family History Family History  Problem Relation Age of Onset   Colon cancer Mother    Heart attack Mother    Other Father        MVA    Social History Social History   Tobacco Use   Smoking status: Never   Smokeless tobacco: Never  Vaping Use   Vaping Use: Never used  Substance Use Topics   Alcohol use: No   Drug use: No     Allergies   Ciprofloxacin, Codeine, Metronidazole, and Sumatriptan   Review of Systems Review of Systems  Constitutional:  Positive for chills. Negative for fever.  HENT:  Positive for congestion, postnasal drip and sore throat. Negative for ear pain and rhinorrhea.   Respiratory:  Positive for cough and wheezing. Negative for shortness of breath.   Gastrointestinal:  Negative for diarrhea, nausea and vomiting.     Physical Exam Triage Vital Signs ED Triage Vitals  Enc Vitals Group     BP      Pulse      Resp      Temp      Temp src      SpO2       Weight      Height      Head Circumference      Peak Flow      Pain Score      Pain Loc      Pain Edu?      Excl. in [place]?    No data found.  Updated Vital Signs BP (!) 168/75 (BP Location: Left Arm)   Pulse 61   Temp 98.4 F (36.9 C) (Oral)   Ht '5\' 5"'$  (1.651 m)   Wt 145 lb (65.8 kg)   SpO2 98%   BMI 24.13 kg/m   Visual Acuity Right Eye Distance:   Left Eye Distance:   Bilateral Distance:    Right Eye Near:   Left Eye Near:    Bilateral Near:     Physical Exam Vitals and nursing  note reviewed.  Constitutional:      Appearance: Normal appearance. She is not ill-appearing.  HENT:     Head: Normocephalic and atraumatic.     Right Ear: Tympanic membrane, ear canal and external ear normal. There is no impacted cerumen.     Left Ear: Tympanic membrane, ear canal and external ear normal. There is no impacted cerumen.     Nose: Congestion and rhinorrhea present.     Comments: Nasal mucosa is erythematous and edematous with scant clear discharge in both nares.    Mouth/Throat:     Mouth: Mucous membranes are moist.     Pharynx: Oropharynx is clear. Posterior oropharyngeal erythema present. No oropharyngeal exudate.     Comments: Posterior oropharynx is mildly erythematous but free of injection.  There is clear postnasal drip on exam. Cardiovascular:     Rate and Rhythm: Normal rate and regular rhythm.     Pulses: Normal pulses.     Heart sounds: Normal heart sounds. No murmur heard.    No friction rub. No gallop.  Pulmonary:     Effort: Pulmonary effort is normal.     Breath sounds: Normal breath sounds. No wheezing, rhonchi or rales.  Musculoskeletal:     Cervical back: Normal range of motion and neck supple.  Lymphadenopathy:     Cervical: No cervical adenopathy.  Skin:    General: Skin is warm and dry.     Capillary Refill: Capillary refill takes less than 2 seconds.  Neurological:     Mental Status: She is alert.      UC Treatments / Results  Labs (all  labs ordered are listed, but only abnormal results are displayed) Labs Reviewed  RESP PANEL BY RT-PCR (RSV, FLU A&B, COVID)  RVPGX2 - Abnormal; Notable for the following components:      Result Value   Resp Syncytial Virus by PCR POSITIVE (*)    All other components within normal limits    EKG   Radiology No results found.  Procedures Procedures (including critical care time)  Medications Ordered in UC Medications - No data to display  Initial Impression / Assessment and Plan / UC Course  I have reviewed the triage vital signs and the nursing notes.  Pertinent labs & imaging results that were available during my care of the patient were reviewed by me and considered in my medical decision making (see chart for details).   Patient is a pleasant, nontoxic-appearing 82 year old female presenting for evaluation forays with a respiratory symptoms as outlined in HPI above.  She is not in any acute distress and she can speak in full sentences without dyspnea or tachypnea.  With an oral temp of 98.4 and her room air oxygen saturation is 98%.  Her physical exam reveals inflammation of her nasal mucosa as well as clear rhinorrhea and clear postnasal drip.  Cardiopulmonary exam reveals clear lung sounds in all fields.  However, given her history of cancer coupled with her feeling of chest tightness I will obtain a chest x-ray to look for any acute cardiopulmonary process.  Respiratory panel is positive for RSV.  X-ray independently reviewed and evaluated by me.  Pression: The lung fields are well aerated and there is no evidence of infiltrate or effusion.  Radiology overread is pending.  I will discharge the patient home with a diagnosis of RSV and prescribe her an albuterol inhaler that she can use at home as needed for wheezing.  She can take 2 puffs every 4-6 hours.  I will also prescribe Atrovent nasal spray and Tessalon Perles to help with cough.   Final Clinical Impressions(s) / UC  Diagnoses   Final diagnoses:  Acute bronchiolitis due to respiratory syncytial virus (RSV)     Discharge Instructions      Also for RSV today.  This is a respiratory virus that will have to run its course and typically take 7 to 10 days.  Use the albuterol inhaler with a spacer, 1 to 2 puffs every 4-6 hours, as needed for shortness of breath or wheezing.  Use the Tessalon Perles every 8 hours during the day as needed for cough.  You may take them with a small sip of water.  They may give you numbness to the base of your tongue or metallic taste in her mouth, this is normal.  Use over-the-counter cough preparations such as Delsym, Robitussin, or Zarbee's according to the package instructions to help with cough and congestion.  Use the Atrovent nasal spray, 2 squirts of each nostril every 6 hours, as needed for nasal congestion and postnasal drip.  If you develop any new or worsening symptoms either return for reevaluation, see your primary care provider, or seek care in the ER.     ED Prescriptions     Medication Sig Dispense Auth. Provider   albuterol (VENTOLIN HFA) 108 (90 Base) MCG/ACT inhaler Inhale 2 puffs into the lungs every 4 (four) hours as needed. 18 g Margarette Canada, NP   Spacer/Aero-Holding Chambers (AEROCHAMBER MV) inhaler Use as instructed 1 each Margarette Canada, NP   benzonatate (TESSALON) 100 MG capsule Take 2 capsules (200 mg total) by mouth every 8 (eight) hours. 21 capsule Margarette Canada, NP   ipratropium (ATROVENT) 0.06 % nasal spray Place 2 sprays into both nostrils 4 (four) times daily. 15 mL Margarette Canada, NP      PDMP not reviewed this encounter.   Margarette Canada, NP 10/24/22 [phone]

## 2022-10-24 NOTE — ED Provider Notes (Signed)
[facility] Provider Note    Event Date/Time   First MD Initiated Contact with Patient 10/24/22 2026     (approximate)   History   Emesis   HPI  Paula Daniels is a 82 y.o. female who presents to the emergency department today because of concerns for nausea vomiting diarrhea as well as some chest pain.  The patient went to urgent care earlier today because of concerns for cough, nasal congestion and sore throat.  Was diagnosed with RSV.  Since getting home she tried taking some the cough medicine and a few hours later started having bad nausea vomiting diarrhea.  She stated this then started causing some slight chest discomfort in her lower chest.  She then became concerned.  Time my exam she states she is feeling somewhat better although continues to have some nausea.  She denies any fevers.     Physical Exam   Triage Vital Signs: ED Triage Vitals  Enc Vitals Group     BP 10/24/22 1929 (!) 143/71     Pulse Rate 10/24/22 1929 60     Resp 10/24/22 1929 18     Temp 10/24/22 1929 99.7 F (37.6 C)     Temp Source 10/24/22 1929 Oral     SpO2 10/24/22 1923 98 %     Weight 10/24/22 1929 145 lb (65.8 kg)     Height 10/24/22 1929 '5\' 5"'$  (1.651 m)     Head Circumference --      Peak Flow --      Pain Score 10/24/22 1929 0     Pain Loc --      Pain Edu? --      Excl. in [place]? --     Most recent vital signs: Vitals:   10/24/22 1923 10/24/22 1929  BP:  (!) 143/71  Pulse:  60  Resp:  18  Temp:  99.7 F (37.6 C)  SpO2: 98% 97%   General: Awake, alert, oriented. CV:  Good peripheral perfusion. Regular rate and rhythm. Resp:  Normal effort. Lungs clear. Abd:  No distention.    ED Results / Procedures / Treatments   Labs (all labs ordered are listed, but only abnormal results are displayed) Labs Reviewed  CBC WITH DIFFERENTIAL/PLATELET - Abnormal; Notable for the following components:      Result Value   RBC 3.82 (*)    Hemoglobin 11.9 (*)     Platelets 127 (*)    All other components within normal limits  COMPREHENSIVE METABOLIC PANEL - Abnormal; Notable for the following components:   Potassium 3.3 (*)    Glucose, Bld 111 (*)    Calcium 8.1 (*)    All other components within normal limits  URINALYSIS, ROUTINE W REFLEX MICROSCOPIC - Abnormal; Notable for the following components:   Color, Urine YELLOW (*)    APPearance CLEAR (*)    Ketones, ur 20 (*)    All other components within normal limits     EKG  None   RADIOLOGY None   PROCEDURES:  Critical Care performed: No    MEDICATIONS ORDERED IN ED: Medications  sodium chloride 0.9 % bolus 500 mL (has no administration in time range)  ondansetron (ZOFRAN) injection 4 mg (has no administration in time range)  ondansetron (ZOFRAN) injection 4 mg (4 mg Intravenous Given 10/24/22 1933)     IMPRESSION / MDM / ASSESSMENT AND PLAN / ED COURSE  I reviewed the triage vital signs and the nursing notes.  Differential diagnosis includes, but is not limited to, viral syndrome, pancreatitis, SBO  Patient's presentation is most consistent with acute presentation with potential threat to life or bodily function.   The patient is on the cardiac monitor to evaluate for evidence of arrhythmia and/or significant heart rate changes.  Patient presented to the emergency department today because of concerns for nausea vomiting diarrhea in setting of recent RSV diagnosis.  On exam patient is afebrile here.  Neither significantly hypotensive nor tachycardic.  Blood work without concerning leukocytosis.  This time I think patient likely suffering from viral syndrome.  I discussed this with the patient.  She did feel better after IV fluids and medication.  Will plan on discharging with prescription for Zofran.      FINAL CLINICAL IMPRESSION(S) / ED DIAGNOSES   Final diagnoses:  RSV infection  Nausea vomiting and diarrhea     Note:  This document  was prepared using Dragon voice recognition software and may include unintentional dictation errors.    Nance Pear, MD 10/24/22 [phone]

## 2022-10-24 NOTE — Discharge Instructions (Signed)
Please seek medical attention for any high fevers, chest pain, shortness of breath, change in behavior, persistent vomiting, bloody stool or any other new or concerning symptoms.  

## 2023-04-16 ENCOUNTER — Ambulatory Visit: Admission: RE | Admit: 2023-04-16 | Discharge: 2023-04-16 | Payer: Medicare Other | Source: Ambulatory Visit

## 2023-04-16 VITALS — BP 177/75 | HR 52 | Temp 98.1°F | Resp 16 | Ht 65.0 in | Wt 133.0 lb

## 2023-04-16 DIAGNOSIS — R001 Bradycardia, unspecified: Secondary | ICD-10-CM

## 2023-04-16 DIAGNOSIS — R519 Headache, unspecified: Secondary | ICD-10-CM | POA: Diagnosis not present

## 2023-04-16 DIAGNOSIS — R42 Dizziness and giddiness: Secondary | ICD-10-CM

## 2023-04-16 DIAGNOSIS — I10 Essential (primary) hypertension: Secondary | ICD-10-CM

## 2023-04-16 NOTE — ED Notes (Signed)
Patient is being discharged from the Urgent Care and sent to the Emergency Department via POV . Per Athena Masse PA, patient is in need of higher level of care due to HA,nausea,hx of cancer & elevated BP. Patient is aware and verbalizes understanding of plan of care.  Vitals:   04/16/23 0823  BP: (!) 177/75  Pulse: (!) 52  Resp: 16  Temp: 98.1 F (36.7 C)  SpO2: 97%

## 2023-04-16 NOTE — ED Provider Notes (Signed)
[facility]    CSN: [id number] Arrival date & time: 04/16/23  0807      History   Chief Complaint Chief Complaint  Patient presents with   Otalgia    Appt   Headache   Dizziness    HPI Paula Daniels is a 82 y.o. female presenting for 5-day history of bilateral ear pain, frontal headache, dizziness and nausea.  Patient reports symptoms of dizziness are worsened when turning her head from side-to-side, lying down, and when standing.  Denies any vision changes, confusion, numbness/tingling or weakness, facial droop.  Has a essential tremor which is not any worse than normal.  Takes propranolol for this.  Denies any fever, cough, congestion, sinus pressure/pain, sore throat.  No chest pain palpitations, shortness of breath, wheezing, leg swelling.  Reports some pain in her anterolateral neck but says it is nontender to touch.  Reduced range of motion of neck due to discomfort.  No injuries or falls.  Patient has tried extra Tylenol for headache which did not really help.  Reports that she lives alone.  States that she has had vertigo before but this ultimately feeling vertigo.  She also has history of migraines but says it does not feel like a migraine either.  No history of heart attack, stroke, blood clots.  Medical history significant for bladder cancer, hypertension, migraines, anxiety, essential tremor, hyperlipidemia.  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   Essential hypertension, benign 04/20/2018   Anxiety, generalized 04/14/2018   HTN, goal below 140/90 04/14/2018   Primary osteoarthritis of both knees 04/14/2018   Degenerative spondylolisthesis 12/17/2016   Spinal stenosis of lumbar 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 claudication 11/08/2016   Benign essential tremor 02/28/2015   Chronic migraine without aura without status migrainosus, not intractable 07/25/2014   Dyslipidemia, goal LDL below 130 03/15/2013   Gastro-esophageal reflux disease without esophagitis 12/03/2012   Contact dermatitis and other eczema due to other specified agent 04/20/2012   Primary localized osteoarthrosis, lower leg 03/06/2012   Derangement of medial meniscus 12/31/2011   Diverticulosis of large intestine without perforation or abscess without bleeding 05/03/2009    Past Surgical History:  Procedure Laterality Date   BACK SURGERY     CATARACT EXTRACTION Bilateral    COLONOSCOPY WITH PROPOFOL N/A 05/20/2022   Procedure: COLONOSCOPY WITH PROPOFOL;  Surgeon: Toney Reil, MD;  Location: [facility] ENDOSCOPY;  Service: Gastroenterology;  Laterality: N/A;   ESOPHAGOGASTRODUODENOSCOPY (EGD) WITH PROPOFOL N/A 05/20/2022   Procedure: ESOPHAGOGASTRODUODENOSCOPY (EGD) WITH PROPOFOL;  Surgeon: Toney Reil, MD;  Location: [facility] ENDOSCOPY;  Service: Gastroenterology;  Laterality: N/A;   EYE SURGERY     JOINT REPLACEMENT     KNEE SURGERY Right     OB History   No obstetric history on file.      Home Medications  Prior to Admission medications   Medication Sig Start Date End Date Taking? Authorizing Provider  acetaminophen (TYLENOL) 500 MG tablet Take 1 tablet by mouth daily.   Yes [provider]  albuterol (VENTOLIN HFA) 108 (90 Base) MCG/ACT inhaler Inhale 2 puffs into the lungs every 4 (four) hours as needed. 10/24/22  Yes Becky Augusta, NP  ALPRAZolam Prudy Feeler) 0.5  MG tablet Take by mouth. 04/10/22  Yes [provider]  amitriptyline (ELAVIL) 25 MG tablet Take by mouth. 12/09/21 04/16/23 Yes [provider]  atorvastatin (LIPITOR) 20 MG tablet Take 1 tablet by mouth daily. 02/06/22 04/16/23 Yes [provider]  Cholecalciferol (VITAMIN D-1000 MAX ST) 25 MCG (1000 UT) tablet Take by mouth.   Yes [provider]  diphenoxylate-atropine (LOMOTIL) 2.5-0.025 MG tablet Take 1 tablet by mouth 2 (two) times daily as needed. 02/03/23  Yes [provider]  famotidine (PEPCID) 20 MG tablet Take 20 mg by mouth at bedtime. 02/15/22  Yes [provider]  gabapentin (NEURONTIN) 300 MG capsule Take by mouth. 11/19/21 04/16/23 Yes [provider]  ipratropium (ATROVENT) 0.06 % nasal spray Place 2 sprays into both nostrils 4 (four) times daily. 10/24/22  Yes Becky Augusta, NP  omeprazole (PRILOSEC) 40 MG capsule Take 40 mg by mouth daily.   Yes [provider]  ondansetron (ZOFRAN) 4 MG tablet Take 1 tablet (4 mg total) by mouth every 8 (eight) hours as needed for vomiting or nausea. 10/24/22  Yes Phineas Semen, MD  potassium chloride SA (K-DUR,KLOR-CON) 20 MEQ tablet Take 20 mEq by mouth daily.   Yes [provider]  propranolol (INDERAL) 40 MG tablet Take 20 mg by mouth 2 (two) times daily.   Yes [provider]  Spacer/Aero-Holding Chambers (AEROCHAMBER MV) inhaler Use as instructed 10/24/22  Yes Becky Augusta, NP  topiramate (TOPAMAX) 50 MG tablet Take 50 mg by mouth daily. 03/07/22  Yes [provider]  venlafaxine (EFFEXOR) 37.5 MG tablet Take 37.5 mg by mouth daily.   Yes [provider]  baclofen (LIORESAL) 10 MG tablet Take 1 tablet (10 mg total) by mouth 3 (three) times daily. 07/07/21   Rodriguez-Southworth, Nettie Elm, PA-C  benzonatate (TESSALON) 100 MG capsule Take 2 capsules (200 mg total) by mouth every 8 (eight) hours. 10/24/22   Becky Augusta, NP    Family History Family  History  Problem Relation Age of Onset   Colon cancer Mother    Heart attack Mother    Other Father        MVA    Social History Social History   Tobacco Use   Smoking status: Never   Smokeless tobacco: Never  Vaping Use   Vaping status: Never Used  Substance Use Topics   Alcohol use: No   Drug use: No     Allergies   Ciprofloxacin, Codeine, Metronidazole, and Sumatriptan   Review of Systems Review of Systems  Constitutional:  Negative for fatigue and fever.  HENT:  Positive for ear pain. Negative for congestion, hearing loss, rhinorrhea and sinus pressure.   Respiratory:  Negative for cough and shortness of breath.   Cardiovascular:  Negative for chest pain, palpitations and leg swelling.  Gastrointestinal:  Positive for nausea. Negative for abdominal pain and vomiting.  Musculoskeletal:  Positive for gait problem and neck pain.  Neurological:  Positive for dizziness, tremors (chronic) and headaches. Negative for seizures, facial asymmetry, speech difficulty, weakness and numbness.  Psychiatric/Behavioral:  Negative for confusion.      Physical  Exam Triage Vital Signs ED Triage Vitals  Encounter Vitals Group     BP 04/16/23 0823 (!) 177/75     Systolic BP Percentile --      Diastolic BP Percentile --      Pulse Rate 04/16/23 0823 (!) 52     Resp 04/16/23 0823 16     Temp 04/16/23 0823 98.1 F (36.7 C)     Temp Source 04/16/23 0823 Oral     SpO2 04/16/23 0823 97 %     Weight 04/16/23 0821 133 lb (60.3 kg)     Height 04/16/23 0821 5\' 5"  (1.651 m)     Head Circumference --      Peak Flow --      Pain Score 04/16/23 0826 8     Pain Loc --      Pain Education --      Exclude from Growth Chart --    No data found.  Updated Vital Signs BP (!) 177/75 (BP Location: Left Arm)   Pulse (!) 52   Temp 98.1 F (36.7 C) (Oral)   Resp 16   Ht 5\' 5"  (1.651 m)   Wt 133 lb (60.3 kg)   SpO2 97%   BMI 22.13 kg/m     Physical Exam Vitals and nursing note  reviewed.  Constitutional:      General: She is not in acute distress.    Appearance: Normal appearance. She is well-developed. She is not ill-appearing or toxic-appearing.  HENT:     Head: Normocephalic and atraumatic.     Right Ear: Tympanic membrane, ear canal and external ear normal.     Left Ear: Tympanic membrane, ear canal and external ear normal.     Nose: Nose normal.     Mouth/Throat:     Mouth: Mucous membranes are moist.     Pharynx: Oropharynx is clear.  Eyes:     General: No scleral icterus.       Right eye: No discharge.        Left eye: No discharge.     Extraocular Movements: Extraocular movements intact.     Conjunctiva/sclera: Conjunctivae normal.     Pupils: Pupils are equal, round, and reactive to light.  Cardiovascular:     Rate and Rhythm: Regular rhythm. Bradycardia present.     Heart sounds: Normal heart sounds.  Pulmonary:     Effort: Pulmonary effort is normal. No respiratory distress.     Breath sounds: Normal breath sounds.  Musculoskeletal:     Cervical back: Neck supple. Tenderness present.  Skin:    General: Skin is dry.  Neurological:     General: No focal deficit present.     Mental Status: She is alert and oriented to person, place, and time. Mental status is at baseline.     Cranial Nerves: No cranial nerve deficit.     Motor: Tremor present. No weakness.     Gait: Gait abnormal (slow, wobbly initially upon standing).  Psychiatric:        Mood and Affect: Mood normal.        Behavior: Behavior normal.        Thought Content: Thought content normal.      UC Treatments / Results  Labs (all labs ordered are listed, but only abnormal results are displayed) Labs Reviewed - No data to display  EKG   Radiology No results found.  Procedures ED EKG  Date/Time: 04/16/2023 9:16 AM  Performed by: Shirlee Latch, PA-C  Authorized by: Shirlee Latch, PA-C   Previous ECG:    Previous ECG:  Unavailable Interpretation:     Interpretation: abnormal   Rate:    ECG rate:  46   ECG rate assessment: bradycardic   Rhythm:    Rhythm: sinus rhythm   Ectopy:    Ectopy: none   QRS:    QRS axis:  Normal   QRS intervals:  Normal   QRS conduction: normal   ST segments:    ST segments:  Normal T waves:    T waves: normal   Comments:     Sinus bradycardia  (including critical care time)  Medications Ordered in UC Medications - No data to display  Initial Impression / Assessment and Plan / UC Course  I have reviewed the triage vital signs and the nursing notes.  Pertinent labs & imaging results that were available during my care of the patient were reviewed by me and considered in my medical decision making (see chart for details).   82 year old female presents for 5-day history of bilateral ear pain, frontal headache, dizziness, nausea.  Medical history significant for bladder cancer, hypertension, migraines, anxiety, essential tremors and hyperlipidemia.  Blood pressure 177/75.  Pulse 52.    On exam she has tremor, slow and wobbly gait upon standing initially.  PERRLA.  Equal and normal strength bilateral upper and lower extremities.  Normal ENT exam.  Chest clear and heart rhythm regular.  EKG performed today shows sinus bradycardia with a rate of 46.  Given patient's elevated blood pressure, slow heart rate and persistent headaches with dizziness, advised further evaluation in the emergency department at this time.  May need cardiac workup and CT imaging.  Patient is dizzy when driving and standing and walking.  Advised EMS or transport by a relative or friend.  She would like me to contact her son.  Spoke with her son Paula Daniels on the phone at [phone].  Patient being taken to [facility] emergency department for further evaluation.  Patient leaving in condition.  Final Clinical Impressions(s) / UC Diagnoses   Final diagnoses:  Acute nonintractable headache, unspecified headache type  Dizziness  Bradycardia   Essential hypertension     Discharge Instructions      You have been advised to follow up immediately in the emergency department for concerning signs.symptoms. If you declined EMS transport, please have a family member take you directly to the ED at this time. Do not delay. Based on concerns about condition, if you do not follow up in th e ED, you may risk poor outcomes including worsening of condition, delayed treatment and potentially life threatening issues. If you have declined to go to the ED at this time, you should call your PCP immediately to set up a follow up appointment.  Go to ED for red flag symptoms, including; fevers you cannot reduce with Tylenol/Motrin, severe headaches, vision changes, numbness/weakness in part of the body, lethargy, confusion, intractable vomiting, severe dehydration, chest pain, breathing difficulty, severe persistent abdominal or pelvic pain, signs of severe infection (increased redness, swelling of an area), feeling faint or passing out, dizziness, etc. You should especially go to the ED for sudden acute worsening of condition if you do not elect to go at this time.      ED Prescriptions   None    PDMP not reviewed this encounter.   Shirlee Latch, PA-C 04/16/23 1524

## 2023-04-16 NOTE — Discharge Instructions (Addendum)
You have been advised to follow up immediately in the emergency department for concerning signs.symptoms. If you declined EMS transport, please have a family member take you directly to the ED at this time. Do not delay. Based on concerns about condition, if you do not follow up in th e ED, you may risk poor outcomes including worsening of condition, delayed treatment and potentially life threatening issues. If you have declined to go to the ED at this time, you should call your PCP immediately to set up a follow up appointment. ° °Go to ED for red flag symptoms, including; fevers you cannot reduce with Tylenol/Motrin, severe headaches, vision changes, numbness/weakness in part of the body, lethargy, confusion, intractable vomiting, severe dehydration, chest pain, breathing difficulty, severe persistent abdominal or pelvic pain, signs of severe infection (increased redness, swelling of an area), feeling faint or passing out, dizziness, etc. You should especially go to the ED for sudden acute worsening of condition if you do not elect to go at this time.  °

## 2023-04-16 NOTE — ED Triage Notes (Signed)
Pt c/o bilateral ear pain,dizziness & HA x5 days. Has tried tylenol w/o relief. States dizziness comes & goes.
